# Patient Record
Sex: Female | Born: 1965 | Race: White | Hispanic: No | Marital: Single | Smoking: Current every day smoker
Health system: Southern US, Community
[De-identification: ages and names within clinical notes are randomized; demographics above are authoritative.]

## PROBLEM LIST (undated history)

## (undated) DIAGNOSIS — I10 Essential (primary) hypertension: Secondary | ICD-10-CM

## (undated) DIAGNOSIS — R51 Headache: Secondary | ICD-10-CM

## (undated) DIAGNOSIS — D649 Anemia, unspecified: Secondary | ICD-10-CM

## (undated) DIAGNOSIS — R519 Headache, unspecified: Secondary | ICD-10-CM

## (undated) DIAGNOSIS — Z9289 Personal history of other medical treatment: Secondary | ICD-10-CM

## (undated) HISTORY — PX: TUBAL LIGATION: SHX77

## (undated) HISTORY — DX: Headache, unspecified: R51.9

## (undated) HISTORY — DX: Essential (primary) hypertension: I10

## (undated) HISTORY — DX: Headache: R51

## (undated) HISTORY — DX: Anemia, unspecified: D64.9

## (undated) HISTORY — DX: Personal history of other medical treatment: Z92.89

---

## 2000-05-24 ENCOUNTER — Encounter: Admission: RE | Admit: 2000-05-24 | Discharge: 2000-08-22 | Payer: Self-pay | Admitting: Gynecology

## 2000-05-24 ENCOUNTER — Other Ambulatory Visit: Admission: RE | Admit: 2000-05-24 | Discharge: 2000-05-24 | Payer: Self-pay | Admitting: Gynecology

## 2000-08-22 ENCOUNTER — Encounter: Admission: RE | Admit: 2000-08-22 | Discharge: 2000-11-20 | Payer: Self-pay | Admitting: Internal Medicine

## 2000-11-13 ENCOUNTER — Inpatient Hospital Stay (HOSPITAL_COMMUNITY): Admission: AD | Admit: 2000-11-13 | Discharge: 2000-11-15 | Payer: Self-pay | Admitting: Gynecology

## 2001-01-15 ENCOUNTER — Other Ambulatory Visit: Admission: RE | Admit: 2001-01-15 | Discharge: 2001-01-15 | Payer: Self-pay | Admitting: Gynecology

## 2015-10-15 ENCOUNTER — Encounter (HOSPITAL_BASED_OUTPATIENT_CLINIC_OR_DEPARTMENT_OTHER): Payer: Self-pay | Admitting: *Deleted

## 2015-10-15 ENCOUNTER — Emergency Department (HOSPITAL_BASED_OUTPATIENT_CLINIC_OR_DEPARTMENT_OTHER)
Admission: EM | Admit: 2015-10-15 | Discharge: 2015-10-15 | Disposition: A | Discharge status: Home / Self Care | Payer: 59 | Attending: Emergency Medicine | Admitting: Emergency Medicine

## 2015-10-15 DIAGNOSIS — IMO0001 Reserved for inherently not codable concepts without codable children: Secondary | ICD-10-CM

## 2015-10-15 DIAGNOSIS — R030 Elevated blood-pressure reading, without diagnosis of hypertension: Secondary | ICD-10-CM | POA: Insufficient documentation

## 2015-10-15 DIAGNOSIS — Z720 Tobacco use: Secondary | ICD-10-CM | POA: Insufficient documentation

## 2015-10-15 DIAGNOSIS — F17200 Nicotine dependence, unspecified, uncomplicated: Secondary | ICD-10-CM

## 2015-10-15 MED ORDER — KETOROLAC TROMETHAMINE 60 MG/2ML IM SOLN: 60.0000 mg | Freq: Once | INTRAMUSCULAR | Status: DC

## 2015-10-15 NOTE — ED Provider Notes (Signed)
CSN: [id number]     Arrival date & time 10/15/15  2055 History   First MD Initiated Contact with Patient 10/15/15 2112     Chief Complaint  Patient presents with  . Hypertension     (Consider location/radiation/quality/duration/timing/severity/associated sxs/prior Treatment) HPI   Blood pressure 198/90, pulse 98, temperature 98.3 F (36.8 C), temperature source Oral, resp. rate 20, height 5' (1.524 m), weight 140 lb (63.504 kg), SpO2 95 %.  Carla Phillips is a 49 y.o. female complaining of elevated blood pressure. Patient took her blood pressure at home tonight and it continued to rise that she checked it throughout the day. Patient was seen in urgent care for a sinus infection several days ago and told her blood pressure was high at that point. Patient endorses maxillary headache. She denies change in vision, cervicalgia, unilateral weakness, ataxia, chest pain, shortness of breath, abdominal pain, nausea vomiting, change in bowel or bladder habits. Patient is a daily tobacco user. She doesn't have primary care follow-up. Patient is insured.  History reviewed. No pertinent past medical history. History reviewed. No pertinent past surgical history. No family history on file. Social History  Substance Use Topics  . Smoking status: Current Every Day Smoker -- 0.50 packs/day    Types: Cigarettes  . Smokeless tobacco: None  . Alcohol Use: Yes     Comment: daily   OB History    No data available     Review of Systems  10 systems reviewed and found to be negative, except as noted in the HPI.   Allergies  Review of patient's allergies indicates no known allergies.  Home Medications   Prior to Admission medications   Not on File   BP 198/90 mmHg  Pulse 98  Temp(Src) 98.3 F (36.8 C) (Oral)  Resp 20  Ht 5' (1.524 m)  Wt 140 lb (63.504 kg)  BMI 27.34 kg/m2  SpO2 95% Physical Exam  Constitutional: She is oriented to person, place, and time. She appears well-developed  and well-nourished. No distress.  HENT:  Head: Normocephalic and atraumatic.  Mouth/Throat: Oropharynx is clear and moist.  Eyes: Conjunctivae and EOM are normal. Pupils are equal, round, and reactive to light.  Neck: Normal range of motion.  Cardiovascular: Normal rate, regular rhythm and intact distal pulses.   Pulmonary/Chest: Effort normal and breath sounds normal. No stridor. No respiratory distress. She has no wheezes. She has no rales. She exhibits no tenderness.  Abdominal: Soft. Bowel sounds are normal. She exhibits no distension and no mass. There is no tenderness. There is no rebound and no guarding.  Musculoskeletal: Normal range of motion.  Neurological: She is alert and oriented to person, place, and time.  Psychiatric: She has a normal mood and affect.  Nursing note and vitals reviewed.   ED Course  Procedures (including critical care time) Labs Review Labs Reviewed - No data to display  Imaging Review No results found. I have personally reviewed and evaluated these images and lab results as part of my medical decision-making.   EKG Interpretation None      MDM   Final diagnoses:  Elevated blood pressure  Tobacco use disorder    Filed Vitals:   10/15/15 2102 10/15/15 2206  BP: 198/90 155/84  Pulse: 98 82  Temp: 98.3 F (36.8 C)   TempSrc: Oral   Resp: 20 20  Height: 5' (1.524 m)   Weight: 140 lb (63.504 kg)   SpO2: 95% 98%    Carla Phillips is  49 y.o. female presenting with elevated blood pressure. Patient is asymptomatic. We have had an extensive discussion about smoking cessation and low salt diet. Patient has the option of following with primary care, she is insured. No indication for emergent blood pressure lowering at this time. I believe the patient checking her blood pressure is contributing to her anxiety and raising blood pressure, advised her to stop checking her blood pressure at home.  Evaluation does not show pathology that would  require ongoing emergent intervention or inpatient treatment. Pt is hemodynamically stable and mentating appropriately. Discussed findings and plan with patient/guardian, who agrees with care plan. All questions answered. Return precautions discussed and outpatient follow up given.       Wynetta Emery, PA-C 10/15/15 2210  Geoffery Lyons, MD 10/15/15 2244

## 2015-10-15 NOTE — Discharge Instructions (Signed)
Please follow with your primary care doctor in the next 5 days for high blood pressure evaluation. If you do not have a primary care doctor, present to urgent care. Reduce salt intake. Seek emergency medical care for unilateral weakness, slurring, change in vision, or chest pain and shortness of breath.  Do not hesitate to return to the emergency room for any new, worsening or concerning symptoms.  Please obtain primary care using resource guide below. Let them know that you were seen in the emergency room and that they will need to obtain records for further outpatient management.     Hypertension Hypertension, commonly called high blood pressure, is when the force of blood pumping through your arteries is too strong. Your arteries are the blood vessels that carry blood from your heart throughout your body. A blood pressure reading consists of a higher number over a lower number, such as 110/72. The higher number (systolic) is the pressure inside your arteries when your heart pumps. The lower number (diastolic) is the pressure inside your arteries when your heart relaxes. Ideally you want your blood pressure below 120/80. Hypertension forces your heart to work harder to pump blood. Your arteries may become narrow or stiff. Having untreated or uncontrolled hypertension can cause heart attack, stroke, kidney disease, and other problems. RISK FACTORS Some risk factors for high blood pressure are controllable. Others are not.  Risk factors you cannot control include:   Race. You may be at higher risk if you are African American.  Age. Risk increases with age.  Gender. Men are at higher risk than women before age 49 years. After age 62, women are at higher risk than men. Risk factors you can control include:  Not getting enough exercise or physical activity.  Being overweight.  Getting too much fat, sugar, calories, or salt in your diet.  Drinking too much alcohol. SIGNS AND  SYMPTOMS Hypertension does not usually cause signs or symptoms. Extremely high blood pressure (hypertensive crisis) may cause headache, anxiety, shortness of breath, and nosebleed. DIAGNOSIS To check if you have hypertension, your health care provider will measure your blood pressure while you are seated, with your arm held at the level of your heart. It should be measured at least twice using the same arm. Certain conditions can cause a difference in blood pressure between your right and left arms. A blood pressure reading that is higher than normal on one occasion does not mean that you need treatment. If it is not clear whether you have high blood pressure, you may be asked to return on a different day to have your blood pressure checked again. Or, you may be asked to monitor your blood pressure at home for 1 or more weeks. TREATMENT Treating high blood pressure includes making lifestyle changes and possibly taking medicine. Living a healthy lifestyle can help lower high blood pressure. You may need to change some of your habits. Lifestyle changes may include:  Following the DASH diet. This diet is high in fruits, vegetables, and whole grains. It is low in salt, red meat, and added sugars.  Keep your sodium intake below 2,300 mg per day.  Getting at least 30-45 minutes of aerobic exercise at least 4 times per week.  Losing weight if necessary.  Not smoking.  Limiting alcoholic beverages.  Learning ways to reduce stress. Your health care provider may prescribe medicine if lifestyle changes are not enough to get your blood pressure under control, and if one of the following is true:  You are 54-68 years of age and your systolic blood pressure is above 140.  You are 75 years of age or older, and your systolic blood pressure is above 150.  Your diastolic blood pressure is above 90.  You have diabetes, and your systolic blood pressure is over 140 or your diastolic blood pressure is over  90.  You have kidney disease and your blood pressure is above 140/90.  You have heart disease and your blood pressure is above 140/90. Your personal target blood pressure may vary depending on your medical conditions, your age, and other factors. HOME CARE INSTRUCTIONS  Have your blood pressure rechecked as directed by your health care provider.   Take medicines only as directed by your health care provider. Follow the directions carefully. Blood pressure medicines must be taken as prescribed. The medicine does not work as well when you skip doses. Skipping doses also puts you at risk for problems.  Do not smoke.   Monitor your blood pressure at home as directed by your health care provider. SEEK MEDICAL CARE IF:   You think you are having a reaction to medicines taken.  You have recurrent headaches or feel dizzy.  You have swelling in your ankles.  You have trouble with your vision. SEEK IMMEDIATE MEDICAL CARE IF:  You develop a severe headache or confusion.  You have unusual weakness, numbness, or feel faint.  You have severe chest or abdominal pain.  You vomit repeatedly.  You have trouble breathing. MAKE SURE YOU:   Understand these instructions.  Will watch your condition.  Will get help right away if you are not doing well or get worse.   This information is not intended to replace advice given to you by your health care provider. Make sure you discuss any questions you have with your health care provider.   Document Released: 12/12/2005 Document Revised: 04/28/2015 Document Reviewed: 10/04/2013 Elsevier Interactive Patient Education 2016 [employer]. Emergency Department Resource Guide 1) Find a Doctor and Pay Out of Pocket Although you won't have to find out who is covered by your insurance plan, it is a good idea to ask around and get recommendations. You will then need to call the office and see if the doctor you have chosen will accept you as a new  patient and what types of options they offer for patients who are self-pay. Some doctors offer discounts or will set up payment plans for their patients who do not have insurance, but you will need to ask so you aren't surprised when you get to your appointment.  2) Contact Your Local Health Department Not all health departments have doctors that can see patients for sick visits, but many do, so it is worth a call to see if yours does. If you don't know where your local health department is, you can check in your phone book. The CDC also has a tool to help you locate your state's health department, and many state websites also have listings of all of their local health departments.  3) Find a Walk-in Clinic If your illness is not likely to be very severe or complicated, you may want to try a walk in clinic. These are popping up all over the country in pharmacies, drugstores, and shopping centers. They're usually staffed by nurse practitioners or physician assistants that have been trained to treat common illnesses and complaints. They're usually fairly quick and inexpensive. However, if you have serious medical issues or chronic medical problems, these are  probably not your best option.  No Primary Care Doctor: - Call Health Connect at  [phone] - they can help you locate a primary care doctor that  accepts your insurance, provides certain services, etc. - Physician Referral Service- [phone]  Chronic Pain Problems: Organization         Address  Phone   Notes  [facility]  [phone]) [phone] Patients need to be referred by their primary care doctor.   Medication Assistance: Organization         Address  Phone   Notes  [facility] Medication [facility] [address] [place], [place] [address] [phone] --Must be a resident of [place] [facility] [place] -- Must have NO insurance coverage whatsoever (no Medicaid/ Medicare, etc.) -- The pt. MUST have a primary  care doctor that directs their care regularly and follows them in the community   Med[facility]ist  [phone]   [employer]  [phone]    Agencies that provide inexpensive medical care: Organization         Address  Phone   Notes  [facility]  [phone]   [facility]    [phone]   [facility] [address] [place], [place] [address] [phone]) [phone]   [facility] [address], [place] [phone]   [facility]anned Parenthood    [phone]) [phone]   [facility]    [phone]   [facility]  [address], [place] Phone:  [phone], Fax:  [phone] Hours of Operation:  9 am - 6 pm, M-F.  Also accepts Medicaid/Medicare and self-pay.  [facility]  [address], [place] Phone: [phone]) [phone], Fax: [phone]) [phone]. Hours of Operation:  8:30 am - 5:30 pm, M-F.  Also accepts Medicaid and self-pay.  [facility] [place] [address], [place] Phone: [phone](336) [phone]   [facility] [address], [place], [place] [phone], Ext. [phone] Mondays & Thursdays: 7-9 AM.  First 15 patients are seen on a first come, first serve basis.    Medicaid-accepting [facility] Providers:  Organization         Address  Phone   Notes  [facility] [address], [place] [phone] Also accepts self-pay patients.  [facility] [address], [place]  [phone]   [facility] [address], [place] [phone]) [phone]   [facility] [address] [place] [address], [place] [phone]) [phone]   Renaye RakersVeita Bland [address], [place]   [phone] Only accepts [place] Access [place] patients after they have their name applied to their card.   Self-Pay (no insurance) in [facility]  [place]:  Organization         Address  Phone   Notes  Sickle Cell Patients, [facility] [address], [place] [phone]   [facility] [address], [place] [phone]   [facility]  [address], [place] [phone]   [facility]/Dr. Osei-Bonsu  [address] [place] [address], [place] or [phone], [place] [phone] Phone number for both [place] and [place] locations is the same.  [facility]  [address], [place] [phone]   [facility] [address] [place] or [address] [phone]   [facility] [address], [place] [phone], phone; [phone], fax Sees patients 1st and 3rd Saturday of every month.  Must not qualify for public or private insurance (i.e. Medicaid, Medicare, [place] Health Choice, Veterans' Benefits)  Household income should be no more than 200% of the poverty level The clinic cannot treat you if you are pregnant or think you are pregnant  Sexually transmitted diseases are not treated at the clinic.    Dental Care: Organization         Address  Phone  Notes  [facility] [address] [place], [place] [phone] Accepts children up to age 68 who are enrolled in [place] or [place] Health Choice; pregnant women with a Medicaid card; and children who have applied for Medicaid or [place] Health Choice, but were declined, whose parents can pay a reduced fee at time of service.  [facility]  [address], [place] [phone] Accepts children up to age 26 who are enrolled in [place] or [place] Health Choice; pregnant women with a Medicaid card; and children who have applied for Medicaid or [place] Health Choice, but were declined, whose parents can  pay a reduced fee at time of service.  [facility]  [address] [place], [place] [phone] Patients are seen by appointment only. Walk-ins are not accepted. [facility] will see patients 11 years of age and older. Monday - Tuesday (8am-5pm) Most Wednesdays (8:30-5pm) $30 per visit, cash only  [facility]  [address], [facility] [phone] Patients are seen by appointment only. Walk-ins are not accepted. [facility] will see patients 70 years of age and older. One Wednesday Evening (Monthly: Volunteer Based).  $30 per visit, cash only  [employer]  [phone] for adults; Children under age 59, call Graduate Pediatric Dentistry at [phone]. Children aged 104-14, please call [phone] to request a pediatric application.  Dental services are provided in all areas of dental care including fillings, crowns and bridges, complete and partial dentures, implants, gum treatment, root canals, and extractions. Preventive care is also provided. Treatment is provided to both adults and children. Patients are selected via a lottery and there is often a waiting list.   [facility] [address], [place]  [phone] [email or web address]   [facility] [address] [place], [place] [phone], Ext. [phone] Second and Fourth Thursday of each month, opens at 6:30 AM; Clinic ends at 9 AM.  Patients are seen on a first-come first-served basis, and a limited number are seen during each clinic.   [facility]  [address] [place] [place], [place] [phone]   Eligibility Requirements You must have lived in [place], [place], or [place] counties for at least the last three months.   You cannot be eligible for state or federal sponsored [place], including [employer], [place], or [employer].   You generally cannot be eligible for healthcare  insurance through your employer.    How to apply: Eligibility screenings are held every Tuesday and Wednesday afternoon from 1:00 pm until 4:00 pm. You do not need an appointment for the interview!  [facility]  [address], [place], [place] [phone]   [facility] Department  [phone]   [facility] Health Department  [phone]   [facility] Health Department  [phone]    Behavioral Health Resources in the Community: Intensive Outpatient Programs Organization         Address  Phone  Notes  [facility] Services [address], [place], [place] [phone]   [facility]patient [address] [place] [address] [place], [place] [phone]   [facility] [address], [place], [place]  [phone]   [facility] [address],  [place], [place] [phone] or [phone]   Substance Abuse Resources Organization         Address  Phone  Notes  Alcohol [facility] Drug Services  [phone]   [facility]  [phone]   [facility] [place]  [phone]   Floydene Flock  [phone]   Residential & Outpatient Substance Abuse Program  [phone]   Psychological Services Organization         Address  Phone  Notes  [facility]  [phone]   [facility]  [phone]   [facility] Mental Health [address] [place] [phone] or [phone]    Mobile Crisis Teams Organization         Address  Phone  Notes  [facility], Mobile Crisis Care Unit  [phone]   [facility]  [address] [place], [place] [phone]   Doristine Locks [address] [place] [phone]    Self-Help/Support Groups Organization         Address  Phone             Notes  [facility] [place] - variety of support groups  [phone]- I7437963 Call for more information  Narcotics Anonymous (NA),  [facility] [address], [employer] [place]  2 meetings at this location   Statistician         Address  Phone  Notes  [facility] [address],    [place]  [phone]   [facility]  [address] [place] [address], [place], [place] [phone]   [facility] [address] [place], [place] [phone] Admissions: 8am-3pm M-F  [facility] [address],    [place], [place] [phone]   [facility] [address] [place], [place], [place] [phone]   [facility] [address]  [place], [place] [phone]   [facility] - Intensive Outpatient [address], [place], [place] [phone]   [facility] ([facility] [address] [place]  [place], [place] [phone] or [phone]   [facility] ([facility]) [address] [place], [place] [phone] Accepts Medicaid  [facility] [place] [address],  [place] [phone] Substance Abuse/Addiction Treatment   [facility] Organization         Address  Phone  Notes  [facility]  [phone]   Angie Fava, PhD [address] [place], [place]   [phone] or [phone]   [facility]al   [address] [place], [place] [phone]   [facility] [address], [place], [place] [phone] Insurance/Medicaid/sponsorship through [employer] and Families [address]  [place], [place] [phone] [place], [place] [phone]    Dr. Adele Schilder  [phone]   [facility] Dept. 1) [address] [place] 2) [place] 3)  [address], [place] [phone]  [phone]    [place] [phone] or [phone] (After Hours)

## 2015-10-15 NOTE — ED Notes (Signed)
[facility]. She took her BP tonight and it was high. No symptoms. States she feels anxious.

## 2015-10-20 ENCOUNTER — Encounter: Payer: Self-pay | Admitting: Family Medicine

## 2015-10-20 ENCOUNTER — Ambulatory Visit (INDEPENDENT_AMBULATORY_CARE_PROVIDER_SITE_OTHER): Payer: 59 | Admitting: Family Medicine

## 2015-10-20 VITALS — BP 164/98 | HR 89 | Temp 98.7°F | Resp 20 | Ht 60.0 in | Wt 142.8 lb

## 2015-10-20 DIAGNOSIS — Z7189 Other specified counseling: Secondary | ICD-10-CM

## 2015-10-20 DIAGNOSIS — Z7689 Persons encountering health services in other specified circumstances: Secondary | ICD-10-CM

## 2015-10-20 DIAGNOSIS — I10 Essential (primary) hypertension: Secondary | ICD-10-CM | POA: Diagnosis not present

## 2015-10-20 DIAGNOSIS — Z0000 Encounter for general adult medical examination without abnormal findings: Secondary | ICD-10-CM | POA: Diagnosis not present

## 2015-10-20 DIAGNOSIS — Z720 Tobacco use: Secondary | ICD-10-CM

## 2015-10-20 DIAGNOSIS — Z23 Encounter for immunization: Secondary | ICD-10-CM

## 2015-10-20 DIAGNOSIS — N951 Menopausal and female climacteric states: Secondary | ICD-10-CM

## 2015-10-20 DIAGNOSIS — Z87891 Personal history of nicotine dependence: Secondary | ICD-10-CM | POA: Insufficient documentation

## 2015-10-20 MED ORDER — HYDROCHLOROTHIAZIDE 25 MG PO TABS: 25.0000 mg | ORAL_TABLET | Freq: Every day | ORAL | Status: DC

## 2015-10-20 NOTE — Patient Instructions (Addendum)
Hypertension Hypertension, commonly called high blood pressure, is when the force of blood pumping through your arteries is too strong. Your arteries are the blood vessels that carry blood from your heart throughout your body. A blood pressure reading consists of a higher number over a lower number, such as 110/72. The higher number (systolic) is the pressure inside your arteries when your heart pumps. The lower number (diastolic) is the pressure inside your arteries when your heart relaxes. Ideally you want your blood pressure below 120/80. Hypertension forces your heart to work harder to pump blood. Your arteries may become narrow or stiff. Having untreated or uncontrolled hypertension can cause heart attack, stroke, kidney disease, and other problems. RISK FACTORS Some risk factors for high blood pressure are controllable. Others are not.  Risk factors you cannot control include:   Race. You may be at higher risk if you are African American.  Age. Risk increases with age.  Gender. Men are at higher risk than women before age 45 years. After age 65, women are at higher risk than men. Risk factors you can control include:  Not getting enough exercise or physical activity.  Being overweight.  Getting too much fat, sugar, calories, or salt in your diet.  Drinking too much alcohol. SIGNS AND SYMPTOMS Hypertension does not usually cause signs or symptoms. Extremely high blood pressure (hypertensive crisis) may cause headache, anxiety, shortness of breath, and nosebleed. DIAGNOSIS To check if you have hypertension, your health care provider will measure your blood pressure while you are seated, with your arm held at the level of your heart. It should be measured at least twice using the same arm. Certain conditions can cause a difference in blood pressure between your right and left arms. A blood pressure reading that is higher than normal on one occasion does not mean that you need treatment. If  it is not clear whether you have high blood pressure, you may be asked to return on a different day to have your blood pressure checked again. Or, you may be asked to monitor your blood pressure at home for 1 or more weeks. TREATMENT Treating high blood pressure includes making lifestyle changes and possibly taking medicine. Living a healthy lifestyle can help lower high blood pressure. You may need to change some of your habits. Lifestyle changes may include:  Following the DASH diet. This diet is high in fruits, vegetables, and whole grains. It is low in salt, red meat, and added sugars.  Keep your sodium intake below 2,300 mg per day.  Getting at least 30-45 minutes of aerobic exercise at least 4 times per week.  Losing weight if necessary.  Not smoking.  Limiting alcoholic beverages.  Learning ways to reduce stress. Your health care provider may prescribe medicine if lifestyle changes are not enough to get your blood pressure under control, and if one of the following is true:  You are 18-59 years of age and your systolic blood pressure is above 140.  You are 60 years of age or older, and your systolic blood pressure is above 150.  Your diastolic blood pressure is above 90.  You have diabetes, and your systolic blood pressure is over 140 or your diastolic blood pressure is over 90.  You have kidney disease and your blood pressure is above 140/90.  You have heart disease and your blood pressure is above 140/90. Your personal target blood pressure may vary depending on your medical conditions, your age, and other factors. HOME CARE INSTRUCTIONS    Have your blood pressure rechecked as directed by your health care provider.   Take medicines only as directed by your health care provider. Follow the directions carefully. Blood pressure medicines must be taken as prescribed. The medicine does not work as well when you skip doses. Skipping doses also puts you at risk for  problems.  Do not smoke.   Monitor your blood pressure at home as directed by your health care provider. SEEK MEDICAL CARE IF:   You think you are having a reaction to medicines taken.  You have recurrent headaches or feel dizzy.  You have swelling in your ankles.  You have trouble with your vision. SEEK IMMEDIATE MEDICAL CARE IF:  You develop a severe headache or confusion.  You have unusual weakness, numbness, or feel faint.  You have severe chest or abdominal pain.  You vomit repeatedly.  You have trouble breathing. MAKE SURE YOU:   Understand these instructions.  Will watch your condition.  Will get help right away if you are not doing well or get worse.   This information is not intended to replace advice given to you by your health care provider. Make sure you discuss any questions you have with your health care provider.   Document Released: 12/12/2005 Document Revised: 04/28/2015 Document Reviewed: 10/04/2013 Elsevier Interactive Patient Education 2016 Elsevier Inc.  Low-Sodium Eating Plan Sodium raises blood pressure and causes water to be held in the body. Getting less sodium from food will help lower your blood pressure, reduce any swelling, and protect your heart, liver, and kidneys. We get sodium by adding salt (sodium chloride) to food. Most of our sodium comes from canned, boxed, and frozen foods. Restaurant foods, fast foods, and pizza are also very high in sodium. Even if you take medicine to lower your blood pressure or to reduce fluid in your body, getting less sodium from your food is important. WHAT IS MY PLAN? Most people should limit their sodium intake to 2,300 mg a day. Your health care provider recommends that you limit your sodium intake to __________ a day.  WHAT DO I NEED TO KNOW ABOUT THIS EATING PLAN? For the low-sodium eating plan, you will follow these general guidelines:  Choose foods with a % Daily Value for sodium of less than 5% (as  listed on the food label).   Use salt-free seasonings or herbs instead of table salt or sea salt.   Check with your health care provider or pharmacist before using salt substitutes.   Eat fresh foods.  Eat more vegetables and fruits.  Limit canned vegetables. If you do use them, rinse them well to decrease the sodium.   Limit cheese to 1 oz (28 g) per day.   Eat lower-sodium products, often labeled as "lower sodium" or "no salt added."  Avoid foods that contain monosodium glutamate (MSG). MSG is sometimes added to [place] food and some canned foods.  Check food labels (Nutrition Facts labels) on foods to learn how much sodium is in one serving.  Eat more home-cooked food and less restaurant, buffet, and fast food.  When eating at a restaurant, ask that your food be prepared with less salt, or no salt if possible.  HOW DO I READ FOOD LABELS FOR SODIUM INFORMATION? The Nutrition Facts label lists the amount of sodium in one serving of the food. If you eat more than one serving, you must multiply the listed amount of sodium by the number of servings. Food labels may also identify foods  as:  Sodium free--Less than 5 mg in a serving.  Very low sodium--35 mg or less in a serving.  Low sodium--140 mg or less in a serving.  Light in sodium--50% less sodium in a serving. For example, if a food that usually has 300 mg of sodium is changed to become light in sodium, it will have 150 mg of sodium.  Reduced sodium--25% less sodium in a serving. For example, if a food that usually has 400 mg of sodium is changed to reduced sodium, it will have 300 mg of sodium. WHAT FOODS CAN I EAT? Grains Low-sodium cereals, including oats, puffed wheat and rice, and shredded wheat cereals. Low-sodium crackers. Unsalted rice and pasta. Lower-sodium bread.  Vegetables Frozen or fresh vegetables. Low-sodium or reduced-sodium canned vegetables. Low-sodium or reduced-sodium tomato sauce and paste.  Low-sodium or reduced-sodium tomato and vegetable juices.  Fruits Fresh, frozen, and canned fruit. Fruit juice.  Meat and Other Protein Products Low-sodium canned tuna and salmon. Fresh or frozen meat, poultry, seafood, and fish. Lamb. Unsalted nuts. Dried beans, peas, and lentils without added salt. Unsalted canned beans. Homemade soups without salt. Eggs.  Dairy Milk. Soy milk. Ricotta cheese. Low-sodium or reduced-sodium cheeses. Yogurt.  Condiments Fresh and dried herbs and spices. Salt-free seasonings. Onion and garlic powders. Low-sodium varieties of mustard and ketchup. Fresh or refrigerated horseradish. Lemon juice.  Fats and Oils Reduced-sodium salad dressings. Unsalted butter.  Other Unsalted popcorn and pretzels.  The items listed above may not be a complete list of recommended foods or beverages. Contact your dietitian for more options. WHAT FOODS ARE NOT RECOMMENDED? Grains Instant hot cereals. Bread stuffing, pancake, and biscuit mixes. Croutons. Seasoned rice or pasta mixes. Noodle soup cups. Boxed or frozen macaroni and cheese. Self-rising flour. Regular salted crackers. Vegetables Regular canned vegetables. Regular canned tomato sauce and paste. Regular tomato and vegetable juices. Frozen vegetables in sauces. Salted JamaicaFrench fries. Olives. Rosita FirePickles. Relishes. Sauerkraut. Salsa. Meat and Other Protein Products Salted, canned, smoked, spiced, or pickled meats, seafood, or fish. Bacon, ham, sausage, hot dogs, corned beef, chipped beef, and packaged luncheon meats. Salt pork. Jerky. Pickled herring. Anchovies, regular canned tuna, and sardines. Salted nuts. Dairy Processed cheese and cheese spreads. Cheese curds. Blue cheese and cottage cheese. Buttermilk.  Condiments Onion and garlic salt, seasoned salt, table salt, and sea salt. Canned and packaged gravies. Worcestershire sauce. Tartar sauce. Barbecue sauce. Teriyaki sauce. Soy sauce, including reduced sodium. Steak  sauce. Fish sauce. Oyster sauce. Cocktail sauce. Horseradish that you find on the shelf. Regular ketchup and mustard. Meat flavorings and tenderizers. Bouillon cubes. Hot sauce. Tabasco sauce. Marinades. Taco seasonings. Relishes. Fats and Oils Regular salad dressings. Salted butter. Margarine. Ghee. Bacon fat.  Other Potato and tortilla chips. Corn chips and puffs. Salted popcorn and pretzels. Canned or dried soups. Pizza. Frozen entrees and pot pies.  The items listed above may not be a complete list of foods and beverages to avoid. Contact your dietitian for more information.   This information is not intended to replace advice given to you by your health care provider. Make sure you discuss any questions you have with your health care provider.   Document Released: 06/03/2002 Document Revised: 01/02/2015 Document Reviewed: 10/16/2013 Elsevier Interactive Patient Education 2016 Elsevier Inc.    BP goal: <140 top number and <80 bottom number. NO lower than 110 top number --> watch for signs of hypotension and call if you experience. (dizziness)  Nurse visit 1 week for BP recheck and labs. (fasting)

## 2015-10-20 NOTE — Progress Notes (Signed)
Subjective:    Patient ID: Carla Phillips, female    DOB: January 26, 1966, 49 y.o.   MRN: [id number]  HPI  Patient presents for new patient establishment with complaints of high blood pressure. All past medical history, surgical history, allergies, family history, immunizations and social history was obtained from the patient today and entered into the electronic medical record. Records are requested from her prior PCP, and will be reviewed at the time they are received. All medical records will be updated at that time.  High blood pressure: Patient reports she was in urgent care for a sinus infection, and was told she had high blood pressure. She states she doesn't recall how high it was, but states that the provider wanted to admit her. Patient had been seen since then and a different urgent care, within our electronic medical record, and her blood pressure at that time was 155/84. She was told she needed to establish with a primary care provider to have follow-up on her blood pressure, what brings her in today. Patient states she has taken her blood pressure home over the last few days, and the lowest systolic was 093, and the highest systolic was 818. Patient denies any chest pain, shortness of breath, visual changes, orthopnea, lower extremity edema or dizziness. Patient states she used to exercise approximately 30 minutes a day, but has gotten away from doing that in the last few months. She states she eats good, watching her diet for fats, but does admit to eating salt. Patient denies any family history of heart disease, hypertension or hyperlipidemia. Patient isn't every day smoker.  Tobacco abuse: Patient states she smokes Marlboro light cigarettes, approximately half a pack to 1 pack a day. She's been on this for approximately 30 years. She does not desire to quit at this time.  Health maintenance:  Colonoscopy: No family history, routine screening age 28 Mammogram: No family history,  routine screening age 59 Cervical cancer screening: 2005, Pap smear indicated Immunizations: Flu vaccination indicated, tetanus indicated, pneumococcal indicated (smoker) Infectious disease screening: HIV indicated  Perimenopausal: Patient states her last menstrual period was June or July. She denies mood swings or hot flashes at this time.  Past Medical History  Diagnosis Date  . Anemia   . Hypertension   . Frequent headaches    No Known Allergies Past Surgical History  Procedure Laterality Date  . Tubal ligation     Family History  Problem Relation Age of Onset  . Arthritis Mother   . COPD Father   . Cancer Neg Hx   . Heart disease Neg Hx    Social History   Social History  . Marital Status: Single    Spouse Name: N/A  . Number of Children: N/A  . Years of Education: N/A   Occupational History  . Not on file.   Social History Main Topics  . Smoking status: Current Every Day Smoker -- 0.50 packs/day    Types: Cigarettes  . Smokeless tobacco: Not on file  . Alcohol Use: Yes     Comment: daily; 3 beers a day  . Drug Use: No  . Sexual Activity: Yes   Other Topics Concern  . 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 Negative for diaphoresis, appetite change, fatigue and unexpected weight change.  HENT: Negative for facial swelling, hearing loss,  postnasal drip, sore throat, tinnitus, trouble swallowing and voice change.   Eyes: Negative for photophobia and visual disturbance.  Respiratory: Negative for chest tightness, shortness of breath and wheezing.   Cardiovascular: Negative for chest pain, palpitations and leg swelling.  Gastrointestinal: Negative for abdominal pain, diarrhea, constipation and blood in stool.  Endocrine: Negative for polyphagia and polyuria.  Genitourinary: Positive for menstrual problem. Negative for dysuria, frequency,  hematuria, vaginal bleeding and vaginal pain.  Skin: Negative.   Neurological: Negative for dizziness, speech difficulty, weakness and headaches.  Hematological: Negative for adenopathy.  Psychiatric/Behavioral: Negative for dysphoric mood. The patient is not nervous/anxious.       Objective:   Physical Exam BP 164/98 mmHg  Pulse 89  Temp(Src) 98.7 F (37.1 C) (Oral)  Resp 20  Ht 5' (1.524 m)  Wt 142 lb 12 oz (64.751 kg)  BMI 27.88 kg/m2  SpO2 96% Gen: Afebrile. No acute distress. Nontoxic in appearance, well-developed, well-nourished, Caucasian female. Mildly overweight. Pleasant. HENT: AT. [place]. Bilateral TM visualized and normal in appearance. MMM. Bilateral nares mild erythema, no swelling. Throat without erythema or exudates. No hoarseness on exam Eyes:Pupils Equal Round Reactive to light, Extraocular movements intact,  Conjunctiva without redness, discharge or icterus. Neck/lymp/endocrine: Supple, no lymphadenopathy, no thyromegaly CV: RRR, no edema, +2/4 P posterior tibialis pulses Chest: CTAB, no wheeze or crackles Abd: Soft. Round. NTND. BS present.  Neuro: Normal gait. PERLA. EOMi. Alert. Oriented x3. Psych: Normal affect, dress and demeanor. Normal speech. Normal thought content and judgment..      Assessment & Plan:  1. Essential hypertension - Start HCTZ 25 mg daily. Patient to follow-up in one week for nurse visit to have BMP rechecked, and blood pressure rechecked. If blood pressure within parameters, will follow-up in 3 months. Otherwise we'll attempt to make medication changes over the phone, with follow-up in one week. - Patient encouraged to increase her exercise to 150 minutes a week, eating a low-salt diet, low saturated/trans-fat diet. - CBC w/Diff - Comp Met (CMET) - TSH - HgB A1c - Lipid panel; Future - Basic Metabolic Panel (BMET); Future - hydrochlorothiazide (HYDRODIURIL) 25 MG tablet; Take 1 tablet (25 mg total) by mouth daily.  Dispense: 30 tablet;  Refill: 0    2. Health care maintenance Health maintenance:  Colonoscopy: No family history, routine screening age 42 Mammogram: No family history, routine screening age 63 Cervical cancer screening: 2005, Pap smear indicated--> patient to make appointment for complete physical within the next 3 months which will include Pap smear Immunizations: Flu vaccination declined, tetanus administered, pneumococcal indicated (smoker) declined Infectious disease screening: HIV declined  3. Encounter to establish care - Patient to make an appointment to have complete physical with Pap within 3 months.  4. Tobacco abuse - Patient declined smoking cessation today.  One week nurse visit

## 2015-10-21 ENCOUNTER — Telehealth: Payer: Self-pay | Admitting: Family Medicine

## 2015-10-21 LAB — CBC WITH DIFFERENTIAL/PLATELET
BASOS ABS: 0 10*3/uL (ref 0.0–0.1)
BASOS PCT: 0.2 % (ref 0.0–3.0)
Eosinophils Absolute: 0.1 10*3/uL (ref 0.0–0.7)
Eosinophils Relative: 1 % (ref 0.0–5.0)
HEMATOCRIT: 42.9 % (ref 36.0–46.0)
Hemoglobin: 14 g/dL (ref 12.0–15.0)
LYMPHS PCT: 33.2 % (ref 12.0–46.0)
Lymphs Abs: 3.2 10*3/uL (ref 0.7–4.0)
MCHC: 32.7 g/dL (ref 30.0–36.0)
MCV: 99.8 fl (ref 78.0–100.0)
MONOS PCT: 7 % (ref 3.0–12.0)
Monocytes Absolute: 0.7 10*3/uL (ref 0.1–1.0)
NEUTROS ABS: 5.6 10*3/uL (ref 1.4–7.7)
Neutrophils Relative %: 58.6 % (ref 43.0–77.0)
PLATELETS: 287 10*3/uL (ref 150.0–400.0)
RBC: 4.3 Mil/uL (ref 3.87–5.11)
RDW: 13.4 % (ref 11.5–15.5)
WBC: 9.6 10*3/uL (ref 4.0–10.5)

## 2015-10-21 LAB — COMPREHENSIVE METABOLIC PANEL
ALBUMIN: 4.3 g/dL (ref 3.5–5.2)
ALK PHOS: 69 U/L (ref 39–117)
ALT: 12 U/L (ref 0–35)
AST: 14 U/L (ref 0–37)
BILIRUBIN TOTAL: 0.3 mg/dL (ref 0.2–1.2)
BUN: 18 mg/dL (ref 6–23)
CALCIUM: 9.7 mg/dL (ref 8.4–10.5)
CO2: 26 meq/L (ref 19–32)
Chloride: 106 mEq/L (ref 96–112)
Creatinine, Ser: 0.92 mg/dL (ref 0.40–1.20)
GFR: 69.02 mL/min (ref >60.00)
Glucose, Bld: 92 mg/dL (ref 70–99)
Potassium: 4.2 mEq/L (ref 3.5–5.1)
Sodium: 140 mEq/L (ref 135–145)
Total Protein: 6.7 g/dL (ref 6.0–8.3)

## 2015-10-21 LAB — TSH: TSH: 0.93 u[IU]/mL (ref 0.35–4.50)

## 2015-10-21 LAB — HEMOGLOBIN A1C: Hgb A1c MFr Bld: 5.2 % (ref 4.6–6.5)

## 2015-10-21 NOTE — Telephone Encounter (Signed)
Left message with results on patient voice mail 

## 2015-10-21 NOTE — Telephone Encounter (Signed)
Please call pt: all labs look great.  - she as an appt for nurse BP recheck after starting med and BMP recheck.

## 2015-10-28 ENCOUNTER — Telehealth: Payer: Self-pay | Admitting: Family Medicine

## 2015-10-28 ENCOUNTER — Ambulatory Visit (INDEPENDENT_AMBULATORY_CARE_PROVIDER_SITE_OTHER): Payer: 59 | Admitting: Family Medicine

## 2015-10-28 DIAGNOSIS — I10 Essential (primary) hypertension: Secondary | ICD-10-CM | POA: Diagnosis not present

## 2015-10-28 LAB — LIPID PANEL
CHOL/HDL RATIO: 2
Cholesterol: 166 mg/dL (ref 0–200)
HDL: 78.9 mg/dL (ref >39.00)
LDL Cholesterol: 71 mg/dL (ref 0–99)
NONHDL: 87.51
TRIGLYCERIDES: 81 mg/dL (ref 0.0–149.0)
VLDL: 16.2 mg/dL (ref 0.0–40.0)

## 2015-10-28 LAB — BASIC METABOLIC PANEL
BUN: 12 mg/dL (ref 6–23)
CHLORIDE: 97 meq/L (ref 96–112)
CO2: 33 meq/L — AB (ref 19–32)
CREATININE: 0.79 mg/dL (ref 0.40–1.20)
Calcium: 9.8 mg/dL (ref 8.4–10.5)
GFR: 82.28 mL/min (ref >60.00)
Glucose, Bld: 79 mg/dL (ref 70–99)
POTASSIUM: 3.3 meq/L — AB (ref 3.5–5.1)
Sodium: 140 mEq/L (ref 135–145)

## 2015-10-28 NOTE — Telephone Encounter (Signed)
Pt came in today for BP check and labs.  BP was 139/86.  Pt wanted to get something for anxiety.  She thinks he BP is elevated due to anxiety.  I told pt she should discuss at OV but pt wanted me to send a message.  Please advise.

## 2015-10-28 NOTE — Telephone Encounter (Signed)
BP is in normal parameters wit medication. No further changes at this time. We will follow up in 3 months for hypertension and we can complete her CPE with PAP at that time. If she is wanting to discuss anxiety, and medications, she will need to be evaluated. Please encouraged her to make an appointment.

## 2015-10-28 NOTE — Telephone Encounter (Signed)
Left message with instructions on patient voice mail. 

## 2015-10-29 ENCOUNTER — Telehealth: Payer: Self-pay | Admitting: Family Medicine

## 2015-10-29 NOTE — Telephone Encounter (Signed)
Spoke with patient reviewed labs and instructions. Patient verbalized understanding. 

## 2015-10-29 NOTE — Telephone Encounter (Signed)
Left message for patient to call back to review labs and instructions. 

## 2015-10-29 NOTE — Telephone Encounter (Signed)
Please call pt: - Her labs are normal, with the exception of mildly low potassium. She should be encouraged to eat foods higher in potassium and/or take a multivitamin with potassium. We will re-check on next appt. if still abnormal with above changes may need to add a daily prescribed potassium pill.  Potassium rich: Bananas, avocado, spinach, sweet potato, etc.

## 2015-11-13 ENCOUNTER — Other Ambulatory Visit: Payer: Self-pay | Admitting: *Deleted

## 2015-11-13 DIAGNOSIS — I10 Essential (primary) hypertension: Secondary | ICD-10-CM

## 2015-11-13 MED ORDER — HYDROCHLOROTHIAZIDE 25 MG PO TABS: 25.0000 mg | ORAL_TABLET | Freq: Every day | ORAL | Status: DC

## 2015-11-13 NOTE — Telephone Encounter (Signed)
HCTZ refilled per refill protocol 

## 2015-11-17 ENCOUNTER — Other Ambulatory Visit: Payer: Self-pay | Admitting: *Deleted

## 2015-11-17 DIAGNOSIS — I10 Essential (primary) hypertension: Secondary | ICD-10-CM

## 2015-11-17 MED ORDER — HYDROCHLOROTHIAZIDE 25 MG PO TABS: 25.0000 mg | ORAL_TABLET | Freq: Every day | ORAL | Status: DC

## 2015-11-17 NOTE — Telephone Encounter (Signed)
Refilled HCTZ per refill protocol 

## 2016-03-14 ENCOUNTER — Other Ambulatory Visit: Payer: Self-pay | Admitting: *Deleted

## 2016-03-14 DIAGNOSIS — I10 Essential (primary) hypertension: Secondary | ICD-10-CM

## 2016-03-14 MED ORDER — HYDROCHLOROTHIAZIDE 25 MG PO TABS: 25.0000 mg | ORAL_TABLET | Freq: Every day | ORAL | Status: DC

## 2016-03-21 ENCOUNTER — Ambulatory Visit (INDEPENDENT_AMBULATORY_CARE_PROVIDER_SITE_OTHER): Payer: BLUE CROSS/BLUE SHIELD | Admitting: Family Medicine

## 2016-03-21 ENCOUNTER — Other Ambulatory Visit (HOSPITAL_COMMUNITY)
Admission: RE | Admit: 2016-03-21 | Discharge: 2016-03-21 | Disposition: A | Discharge status: Home / Self Care | Payer: BLUE CROSS/BLUE SHIELD | Source: Ambulatory Visit | Attending: Family Medicine | Admitting: Family Medicine

## 2016-03-21 ENCOUNTER — Encounter: Payer: Self-pay | Admitting: Family Medicine

## 2016-03-21 VITALS — BP 166/86 | HR 100 | Temp 98.1°F | Resp 20 | Wt 142.0 lb

## 2016-03-21 DIAGNOSIS — Z124 Encounter for screening for malignant neoplasm of cervix: Secondary | ICD-10-CM | POA: Diagnosis not present

## 2016-03-21 DIAGNOSIS — Z0000 Encounter for general adult medical examination without abnormal findings: Secondary | ICD-10-CM

## 2016-03-21 DIAGNOSIS — Z01419 Encounter for gynecological examination (general) (routine) without abnormal findings: Secondary | ICD-10-CM | POA: Diagnosis present

## 2016-03-21 DIAGNOSIS — Z1151 Encounter for screening for human papillomavirus (HPV): Secondary | ICD-10-CM | POA: Insufficient documentation

## 2016-03-21 DIAGNOSIS — I10 Essential (primary) hypertension: Secondary | ICD-10-CM | POA: Diagnosis not present

## 2016-03-21 DIAGNOSIS — Z1239 Encounter for other screening for malignant neoplasm of breast: Secondary | ICD-10-CM | POA: Diagnosis not present

## 2016-03-21 MED ORDER — HYDROCHLOROTHIAZIDE 50 MG PO TABS: 50.0000 mg | ORAL_TABLET | Freq: Every day | ORAL | Status: DC

## 2016-03-21 NOTE — Progress Notes (Signed)
Patient ID: Carla Phillips, female   DOB: 1966/05/19, 50 y.o.   MRN: [id number]      Patient ID: Carla Phillips, female  DOB: 1966/07/09, 50 y.o.   MRN: [id number]  Subjective:  Carla Phillips is a 50 y.o.  All recent labs, ED visits and hospitalizations within the last year were reviewed.  Well women exam/screening for cervical cancer: Pt reports she was perimenopausal, nad had not had a menstrual cycle for 7 months. However, now she has had for normal occuring periods for the last 4-5 months/cycles. The cycles last 5-6 days in duration. Patient's last menstrual period was 03/15/2016. Patient has yet to be screened for breast cancer, no family history. She does perform SBE. She denies dyspareunia, vaginal irritation or discharge today.   Hypertension: Blood pressure above goal today, pt reports compliance with HCTZ 25 mg daily. She does not check her BP at home. She reports not eating high salt foods . She does not exercise routinely.  Past Medical History  Diagnosis Date  . Anemia   . Hypertension   . Frequent headaches   . History of blood transfusion    No Known Allergies Past Surgical History  Procedure Laterality Date  . Tubal ligation     Family History  Problem Relation Age of Onset  . Arthritis Mother   . COPD Father   . Cancer Neg Hx   . Heart disease Neg Hx    Social History   Social History  . Marital Status: Single    Spouse Name: N/A  . Number of Children: N/A  . Years of Education: N/A   Occupational History  . Not on file.   Social History Main Topics  . Smoking status: Current Every Day Smoker -- 0.50 packs/day    Types: Cigarettes  . Smokeless tobacco: Not on file  . Alcohol Use: Yes     Comment: daily; 3 beers a day  . Drug Use: No  . Sexual Activity: Yes   Other Topics Concern  . 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.   Current every day smoker. Drinks 3 beers a day. Denies recreational drugs or caffeine use. Denies herbal remedies.   Wear seatbelt. Does not exercise routinely.   Smoke alarm present home.         ROS: Negative, with the exception of above mentioned in HPI  Objective: BP 166/86 mmHg  Pulse 100  Temp(Src) 98.1 F (36.7 C)  Resp 20  Wt 142 lb (64.411 kg)  SpO2 99%  LMP 03/15/2016 Gen: Afebrile. No acute distress. Nontoxic in appearance, well-developed, well-nourished, female, pleasant female.  HENT: AT. [place]. MMM, no oral lesions.   Eyes:Pupils Equal Round Reactive to light, Extraocular movements intact,  Conjunctiva without redness, discharge or icterus. CV: RRR, no edema Abd: Soft.Round. NTND. BS present Breasts: breasts appear normal, symmetrical, no tenderness on exam, no suspicious masses, no skin or nipple changes or axillary nodes. GYN:  External genitalia within normal limits, normal hair distribution, no lesions. Urethral meatus normal, no lesions. Vaginal mucosa pink, moist, normal rugae, no lesions. No cystocele or rectocele. cervix without lesions, no discharge,  Old blood at OS.   exam.  Bimanual exam revealed normal uterus.  No bladder/suprapubic fullness, masses or tenderness. No cervical motion tenderness. No adnexal fullness. Anus and perineum within normal limits, no lesions.  Assessment/plan: Carla Phillips is a 50 y.o. female present for cervical cancer screening/well women exam.  1. Essential hypertension - increased HCTZ to 50 mg today. Pt to follow up in 1 week with nurse visit for recheck.  - hydrochlorothiazide (HYDRODIURIL) 50 MG tablet; Take 1 tablet (50 mg total) by mouth daily.  Dispense: 30 tablet; Refill: 5   2. Pap smear for cervical cancer screening - Cytology - PAP, with HPV co-test  3. Screening for malignant neoplasm of breast - ordered, pt to schedule after she returns from her vacation.  - MM Digital Diagnostic  Bilat; Future  Patient was encouraged to exercise greater than 150 minutes a week. Patient was encouraged to choose a diet filled with fresh fruits and vegetables, and lean meats. AVS provided to patient today for education/recommendation on gender specific health and safety maintenance.  Follow up 1 week nurse visit for HTN recheck  > 25 minutes spent with patient, >50% of time spent face to face counseling patient and coordinating care.  Electronically signed by: Felix Pacinienee Tehila Sokolow, DO [facility]

## 2016-03-21 NOTE — Patient Instructions (Signed)
Increase HCTZ to 50 mg daily (can take at same time). New script called is 3050, can take 2 of the ones you have currently.  Make a nurse visit for after the beach to have BP rechecked.  We will call you with pap results.  I will order the mammogram for you, have it completed with in the next month if able.  Have fun at the beach!!!

## 2016-03-23 ENCOUNTER — Other Ambulatory Visit: Payer: Self-pay | Admitting: Family Medicine

## 2016-03-23 DIAGNOSIS — Z1239 Encounter for other screening for malignant neoplasm of breast: Secondary | ICD-10-CM

## 2016-03-24 LAB — CYTOLOGY - PAP

## 2016-03-28 ENCOUNTER — Telehealth: Payer: Self-pay | Admitting: Family Medicine

## 2016-03-28 NOTE — Telephone Encounter (Signed)
Left message with pap results on patient voice mail

## 2016-03-28 NOTE — Telephone Encounter (Signed)
Please call pt: - Her PAP is normal with negative HPV. Next PAP 3 years (as long as prior paps normal x3)

## 2016-03-30 ENCOUNTER — Ambulatory Visit: Payer: BLUE CROSS/BLUE SHIELD | Admitting: *Deleted

## 2016-03-30 VITALS — BP 148/90 | HR 91 | Resp 20

## 2016-03-30 DIAGNOSIS — I10 Essential (primary) hypertension: Secondary | ICD-10-CM

## 2016-03-30 NOTE — Progress Notes (Signed)
Patient presents today for BP check. Results documented. Dr Kuneff aware. 

## 2016-05-11 ENCOUNTER — Encounter: Payer: Self-pay | Admitting: Family Medicine

## 2016-06-15 ENCOUNTER — Ambulatory Visit: Payer: BLUE CROSS/BLUE SHIELD

## 2016-07-04 ENCOUNTER — Ambulatory Visit: Payer: BLUE CROSS/BLUE SHIELD

## 2016-07-19 ENCOUNTER — Ambulatory Visit
Admission: RE | Admit: 2016-07-19 | Discharge: 2016-07-19 | Disposition: A | Discharge status: Home / Self Care | Payer: BLUE CROSS/BLUE SHIELD | Source: Ambulatory Visit | Attending: Family Medicine | Admitting: Family Medicine

## 2016-07-19 DIAGNOSIS — Z1239 Encounter for other screening for malignant neoplasm of breast: Secondary | ICD-10-CM

## 2016-07-19 DIAGNOSIS — Z1231 Encounter for screening mammogram for malignant neoplasm of breast: Secondary | ICD-10-CM | POA: Diagnosis not present

## 2016-07-20 ENCOUNTER — Other Ambulatory Visit: Payer: Self-pay | Admitting: Family Medicine

## 2016-07-20 DIAGNOSIS — R928 Other abnormal and inconclusive findings on diagnostic imaging of breast: Secondary | ICD-10-CM

## 2016-07-26 ENCOUNTER — Ambulatory Visit
Admission: RE | Admit: 2016-07-26 | Discharge: 2016-07-26 | Disposition: A | Discharge status: Home / Self Care | Payer: BLUE CROSS/BLUE SHIELD | Source: Ambulatory Visit | Attending: Family Medicine | Admitting: Family Medicine

## 2016-07-26 DIAGNOSIS — R928 Other abnormal and inconclusive findings on diagnostic imaging of breast: Secondary | ICD-10-CM

## 2016-07-26 DIAGNOSIS — N63 Unspecified lump in breast: Secondary | ICD-10-CM | POA: Diagnosis not present

## 2016-09-19 ENCOUNTER — Other Ambulatory Visit: Payer: Self-pay | Admitting: Family Medicine

## 2016-09-19 DIAGNOSIS — I10 Essential (primary) hypertension: Secondary | ICD-10-CM

## 2016-09-19 NOTE — Telephone Encounter (Signed)
Called patient left message on patient voice mail regarding appt for refills on her medication. 30 day supply sent.

## 2016-09-19 NOTE — Telephone Encounter (Signed)
Received refill request for Hypertension med, pt is due for 6 month follow up. I have refilled x1. Please have her make her appt.

## 2016-09-27 ENCOUNTER — Ambulatory Visit (INDEPENDENT_AMBULATORY_CARE_PROVIDER_SITE_OTHER): Payer: BLUE CROSS/BLUE SHIELD | Admitting: Family Medicine

## 2016-09-27 ENCOUNTER — Encounter: Payer: Self-pay | Admitting: Family Medicine

## 2016-09-27 VITALS — BP 149/84 | HR 97 | Temp 98.3°F | Resp 18 | Ht 60.0 in | Wt 141.2 lb

## 2016-09-27 DIAGNOSIS — I10 Essential (primary) hypertension: Secondary | ICD-10-CM | POA: Diagnosis not present

## 2016-09-27 MED ORDER — LISINOPRIL 5 MG PO TABS: 5.0000 mg | ORAL_TABLET | Freq: Every day | ORAL | 0 refills | Status: DC

## 2016-09-27 NOTE — Patient Instructions (Addendum)
Stop HCTZ (current pill). Start Lisinopril once a day.  Nurse visit with in a week to recheck, must have taken lisinopril at least 2 hours prior to visit.    Low salt diet.

## 2016-09-27 NOTE — Progress Notes (Signed)
    Carla BobKimberly P Kelly , [birth year], 50 y.o., female MRN: [id number] Patient Care Team    Relationship Specialty Notifications Start End  Natalia Leatherwoodenee A Arnette Driggs, DO PCP - General Family Medicine  10/20/15     CC: hypertension follow up Subjective: Pt presents for  OV for follow up on her hypertension. She is eating a low salt diet. She denies chest pain, shortness of breath, lower extremity edema. She does not exercise. She reports compliance with her blood pressure medications.   No Known Allergies Social History  Substance Use Topics  . Smoking status: Current Every Day Smoker    Packs/day: 0.50    Types: Cigarettes  . Smokeless tobacco: Never Used  . Alcohol use Yes     Comment: daily; 3 beers a day   Past Medical History:  Diagnosis Date  . Anemia   . Frequent headaches   . History of blood transfusion   . Hypertension    Past Surgical History:  Procedure Laterality Date  . TUBAL LIGATION     Family History  Problem Relation Age of Onset  . Arthritis Mother   . COPD Father   . Cancer Neg Hx   . Heart disease Neg Hx      Medication List       Accurate as of 09/27/16  9:13 AM. Always use your most recent med list.          hydrochlorothiazide 50 MG tablet Commonly known as:  HYDRODIURIL TAKE 1 TABLET BY MOUTH DAILY       No results found for this or any previous visit (from the past 24 hour(s)). No results found.   ROS: Negative, with the exception of above mentioned in HPI   Objective:  BP (!) 149/84 (BP Location: Right Arm, Patient Position: Sitting, Cuff Size: Normal)   Pulse 97   Temp 98.3 F (36.8 C)   Resp 18   Ht 5' (1.524 m)   Wt 141 lb 4 oz (64.1 kg)   SpO2 96%   BMI 27.59 kg/m  Body mass index is 27.59 kg/m. Gen: Afebrile. No acute distress. Nontoxic in appearance, well developed, well nourished. Caucasian female.  HENT: AT. [place]. MMM Eyes:Pupils Equal Round Reactive to light, Extraocular movements intact,  Conjunctiva without redness,  discharge or icterus. CV: RRR , no murmur, no edema Chest: CTAB, no wheeze or crackles. Good air movement, normal resp effort.  Abd: Soft. NTND. BS present. Neuro:  Normal gait. PERLA. EOMi. Alert. Oriented x3   Assessment/Plan: Carla Phillips is a 50 y.o. female present for follow up OV for  Essential hypertension - Mildly elevated again today, pt reports compliance. She states she never had edema.  - Pt agreeable to lisinopril and prefers to take one pill if possible.  - DC Hctz, start lisinopril 5 mg. Pt will need a nurse visit within 1 week, to monitor BP after change. Pt advised to be seen at least 2 hours after taking medication. If needed will increase at that time. - low salt diet and exercise recommended.  - F/U 3 months, if BP normal at nurse visit in 1 week.    electronically signed by:  Felix Pacinienee Vianna Venezia, DO   [facility] - [place]

## 2016-10-04 ENCOUNTER — Ambulatory Visit (INDEPENDENT_AMBULATORY_CARE_PROVIDER_SITE_OTHER): Payer: BLUE CROSS/BLUE SHIELD | Admitting: *Deleted

## 2016-10-04 VITALS — BP 138/82

## 2016-10-04 DIAGNOSIS — I10 Essential (primary) hypertension: Secondary | ICD-10-CM | POA: Diagnosis not present

## 2016-10-04 NOTE — Progress Notes (Signed)
Electronically Signed by: Kaia Depaolis, DO [facility] [place]  

## 2016-10-04 NOTE — Progress Notes (Signed)
Patient presents today for blood pressure check per Dr. Claiborne BillingsKuneff. Blood pressure within normal parameters. Dr. Claiborne BillingsKuneff made aware. No new orders, patient to continue current regimen and to follow up in 3 months.

## 2016-10-07 ENCOUNTER — Other Ambulatory Visit: Payer: Self-pay | Admitting: Family Medicine

## 2016-10-07 ENCOUNTER — Ambulatory Visit (INDEPENDENT_AMBULATORY_CARE_PROVIDER_SITE_OTHER): Payer: BLUE CROSS/BLUE SHIELD | Admitting: Family Medicine

## 2016-10-07 ENCOUNTER — Encounter: Payer: Self-pay | Admitting: Family Medicine

## 2016-10-07 VITALS — BP 139/88 | HR 93 | Temp 98.5°F | Resp 20 | Wt 144.0 lb

## 2016-10-07 DIAGNOSIS — J0100 Acute maxillary sinusitis, unspecified: Secondary | ICD-10-CM | POA: Diagnosis not present

## 2016-10-07 MED ORDER — DOXYCYCLINE HYCLATE 100 MG PO TABS: 100.0000 mg | ORAL_TABLET | Freq: Two times a day (BID) | ORAL | 0 refills | Status: DC

## 2016-10-07 NOTE — Patient Instructions (Signed)
Doxycyline every 12 hours for 10 days. flonase daily Claritin daily mucinex plain .  rest and hydrate.   Sinusitis, Adult Sinusitis is redness, soreness, and inflammation of the paranasal sinuses. Paranasal sinuses are air pockets within the bones of your face. They are located beneath your eyes, in the middle of your forehead, and above your eyes. In healthy paranasal sinuses, mucus is able to drain out, and air is able to circulate through them by way of your nose. However, when your paranasal sinuses are inflamed, mucus and air can become trapped. This can allow bacteria and other germs to grow and cause infection. Sinusitis can develop quickly and last only a short time (acute) or continue over a long period (chronic). Sinusitis that lasts for more than 12 weeks is considered chronic. CAUSES Causes of sinusitis include:  Allergies.  Structural abnormalities, such as displacement of the cartilage that separates your nostrils (deviated septum), which can decrease the air flow through your nose and sinuses and affect sinus drainage.  Functional abnormalities, such as when the small hairs (cilia) that line your sinuses and help remove mucus do not work properly or are not present. SIGNS AND SYMPTOMS Symptoms of acute and chronic sinusitis are the same. The primary symptoms are pain and pressure around the affected sinuses. Other symptoms include:  Upper toothache.  Earache.  Headache.  Bad breath.  Decreased sense of smell and taste.  A cough, which worsens when you are lying flat.  Fatigue.  Fever.  Thick drainage from your nose, which often is green and may contain pus (purulent).  Swelling and warmth over the affected sinuses. DIAGNOSIS Your health care provider will perform a physical exam. During your exam, your health care provider may perform any of the following to help determine if you have acute sinusitis or chronic sinusitis:  Look in your nose for signs of  abnormal growths in your nostrils (nasal polyps).  Tap over the affected sinus to check for signs of infection.  View the inside of your sinuses using an imaging device that has a light attached (endoscope). If your health care provider suspects that you have chronic sinusitis, one or more of the following tests may be recommended:  Allergy tests.  Nasal culture. A sample of mucus is taken from your nose, sent to a lab, and screened for bacteria.  Nasal cytology. A sample of mucus is taken from your nose and examined by your health care provider to determine if your sinusitis is related to an allergy. TREATMENT Most cases of acute sinusitis are related to a viral infection and will resolve on their own within 10 days. Sometimes, medicines are prescribed to help relieve symptoms of both acute and chronic sinusitis. These may include pain medicines, decongestants, nasal steroid sprays, or saline sprays. However, for sinusitis related to a bacterial infection, your health care provider will prescribe antibiotic medicines. These are medicines that will help kill the bacteria causing the infection. Rarely, sinusitis is caused by a fungal infection. In these cases, your health care provider will prescribe antifungal medicine. For some cases of chronic sinusitis, surgery is needed. Generally, these are cases in which sinusitis recurs more than 3 times per year, despite other treatments. HOME CARE INSTRUCTIONS  Drink plenty of water. Water helps thin the mucus so your sinuses can drain more easily.  Use a humidifier.  Inhale steam 3-4 times a day (for example, sit in the bathroom with the shower running).  Apply a warm, moist washcloth to your  face 3-4 times a day, or as directed by your health care provider.  Use saline nasal sprays to help moisten and clean your sinuses.  Take medicines only as directed by your health care provider.  If you were prescribed either an antibiotic or antifungal  medicine, finish it all even if you start to feel better. SEEK IMMEDIATE MEDICAL CARE IF:  You have increasing pain or severe headaches.  You have nausea, vomiting, or drowsiness.  You have swelling around your face.  You have vision problems.  You have a stiff neck.  You have difficulty breathing.   This information is not intended to replace advice given to you by your health care provider. Make sure you discuss any questions you have with your health care provider.   Document Released: 12/12/2005 Document Revised: 01/02/2015 Document Reviewed: 12/27/2011 Elsevier Interactive Patient Education [employer].

## 2016-10-07 NOTE — Progress Notes (Signed)
    Carla BobKimberly P Schuchard , [birth year], 50 y.o., female MRN: [id number] Patient Care Team    Relationship Specialty Notifications Start End  Natalia Leatherwoodenee A Hamdan Toscano, DO PCP - General Family Medicine  10/20/15     CC: cough Subjective: Pt presents for an acute OV with complaints of cough of 5 days duration.  Associated symptoms include scratchy throat, nasal drainage, productive cough, felt feverish, sinus pressure, dizziness. She states the sore throat is worse at night.    No Known Allergies Social History  Substance Use Topics  . Smoking status: Current Every Day Smoker    Packs/day: 0.50    Types: Cigarettes  . Smokeless tobacco: Never Used  . Alcohol use Yes     Comment: daily; 3 beers a day   Past Medical History:  Diagnosis Date  . Anemia   . Frequent headaches   . History of blood transfusion   . Hypertension    Past Surgical History:  Procedure Laterality Date  . TUBAL LIGATION     Family History  Problem Relation Age of Onset  . Arthritis Mother   . COPD Father   . Cancer Neg Hx   . Heart disease Neg Hx      Medication List       Accurate as of 10/07/16  2:46 PM. Always use your most recent med list.          lisinopril 5 MG tablet Commonly known as:  PRINIVIL,ZESTRIL Take 1 tablet (5 mg total) by mouth daily.       No results found for this or any previous visit (from the past 24 hour(s)). No results found.   ROS: Negative, with the exception of above mentioned in HPI   Objective:  BP 139/88 (BP Location: Left Arm, Patient Position: Sitting, Cuff Size: Normal)   Pulse 93   Temp 98.5 F (36.9 C)   Resp 20   Wt 144 lb (65.3 kg)   SpO2 98%   BMI 28.12 kg/m  Body mass index is 28.12 kg/m. Gen: Afebrile. No acute distress. Nontoxic in appearance, well developed, well nourished.  HENT: AT. [place]. Bilateral TM visualized wnl. MMM, no oral lesions. Bilateral nares with erythema. Throat without erythema or exudates. Mild cough on exam. TTP max sinus.    Eyes:Pupils Equal Round Reactive to light, Extraocular movements intact,  Conjunctiva without redness, discharge or icterus. Neck/lymp/endocrine: Supple,no lymphadenopathy CV: RRR  Chest: CTAB, no wheeze or crackles. Good air movement, normal resp effort.  Abd: Soft. NTND. BS present. Skin: no rashes, purpura or petechiae.  Neuro:  Normal gait. PERLA. EOMi. Alert. Oriented x3   Assessment/Plan: Carla Phillips is a 50 y.o. female present for acute OV for  Acute maxillary sinusitis, recurrence not specified - Doxycyline , flonase ,claritin, mucinex plain .  - rest and hydrate.  - work excuse for today.  - F/u 2 weeks if not improved or worsening.   electronically signed by:  Felix Pacinienee Simranjit Thayer, DO  [facility] [place]

## 2016-10-07 NOTE — Progress Notes (Signed)
Received call from team health.  Doxycyline was not received at pharmacy.  Reviewed note from today which did state that she was going to send in doxycyline. eRx sent.

## 2016-10-17 ENCOUNTER — Other Ambulatory Visit: Payer: Self-pay | Admitting: Family Medicine

## 2016-10-17 DIAGNOSIS — I10 Essential (primary) hypertension: Secondary | ICD-10-CM

## 2016-10-28 ENCOUNTER — Telehealth: Payer: Self-pay | Admitting: Family Medicine

## 2016-10-28 MED ORDER — LISINOPRIL 5 MG PO TABS: 5.0000 mg | ORAL_TABLET | Freq: Every day | ORAL | 3 refills | Status: DC

## 2016-10-28 NOTE — Telephone Encounter (Signed)
Pt is calling for a refill of her lisinopril please. She is out of medication. Her last rx should have been for 3 months but was not and her follow up is not until January. [facility] request for HCT[facility] in error. Even though the HCTZ is not on her med list any long we filled it in error. Since she has had trouble with [facility] in the past she asks that we remove it from her pharmacy list and only send medications to [facility] now.

## 2016-11-12 ENCOUNTER — Other Ambulatory Visit: Payer: Self-pay | Admitting: Family Medicine

## 2016-11-12 DIAGNOSIS — I10 Essential (primary) hypertension: Secondary | ICD-10-CM

## 2016-11-26 DIAGNOSIS — B373 Candidiasis of vulva and vagina: Secondary | ICD-10-CM | POA: Diagnosis not present

## 2016-11-26 DIAGNOSIS — R05 Cough: Secondary | ICD-10-CM | POA: Diagnosis not present

## 2016-11-26 DIAGNOSIS — I10 Essential (primary) hypertension: Secondary | ICD-10-CM | POA: Diagnosis not present

## 2016-11-26 DIAGNOSIS — J0140 Acute pansinusitis, unspecified: Secondary | ICD-10-CM | POA: Diagnosis not present

## 2017-01-03 ENCOUNTER — Encounter: Payer: Self-pay | Admitting: Family Medicine

## 2017-01-03 ENCOUNTER — Ambulatory Visit (INDEPENDENT_AMBULATORY_CARE_PROVIDER_SITE_OTHER): Payer: BLUE CROSS/BLUE SHIELD | Admitting: Family Medicine

## 2017-01-03 VITALS — BP 149/78 | HR 90 | Temp 98.5°F | Resp 20 | Ht 60.0 in | Wt 148.0 lb

## 2017-01-03 DIAGNOSIS — I10 Essential (primary) hypertension: Secondary | ICD-10-CM | POA: Diagnosis not present

## 2017-01-03 NOTE — Progress Notes (Signed)
    Carla Phillips , Feb 03, 1966, 51 y.o., female MRN: [id number] Patient Care Team    Relationship Specialty Notifications Start End  Natalia Leatherwoodenee A Jabez Molner, DO PCP - General Family Medicine  10/20/15     CC: hypertension follow up Subjective: Pt presents for OV on hypertension. She reports compliance with lisinopril 5 mg QD. She does not monitor her BP at home. She denies chest pain, shortness of breath or LE edema. She does monitor the slat content in her diet. She has been on HCTZ 50 mg in the past without good control of BP and frequent urination was bothering her.   No Known Allergies Social History  Substance Use Topics  . Smoking status: Current Every Day Smoker    Packs/day: 0.50    Types: Cigarettes  . Smokeless tobacco: Never Used  . Alcohol use Yes     Comment: daily; 3 beers a day   Past Medical History:  Diagnosis Date  . Anemia   . Frequent headaches   . History of blood transfusion   . Hypertension    Past Surgical History:  Procedure Laterality Date  . TUBAL LIGATION     Family History  Problem Relation Age of Onset  . Arthritis Mother   . COPD Father   . Cancer Neg Hx   . Heart disease Neg Hx    Allergies as of 01/03/2017   No Known Allergies     Medication List       Accurate as of 01/03/17  3:08 PM. Always use your most recent med list.          lisinopril 5 MG tablet Commonly known as:  PRINIVIL,ZESTRIL Take 1 tablet (5 mg total) by mouth daily.       No results found for this or any previous visit (from the past 24 hour(s)). No results found.   ROS: Negative, with the exception of above mentioned in HPI   Objective:  BP (!) 149/78 (BP Location: Right Arm, Patient Position: Sitting, Cuff Size: Normal)   Pulse 90   Temp 98.5 F (36.9 C)   Resp 20   Ht 5' (1.524 m)   Wt 148 lb (67.1 kg)   SpO2 99%   BMI 28.90 kg/m  Body mass index is 28.9 kg/m.  Gen: Afebrile. No acute distress.  HENT: AT. [place].  MMM.  CV: RRR, no edema.  Chest:  CTAB, no wheeze or crackles Abd: Soft. NTND. BS present  Assessment/Plan: Carla Phillips is a 51 y.o. female present for follow up OV for  Essential hypertension - Again, mildly above goal. In attempt to stay on one medication, will increase lisinopril to 1.5 tabs (7.5 mg dose) QD.  - low salt diet, and encouraged exercise.  - nurse visit 3 days for recheck. - If BP normal, will call in lisinopril 7.5 mg QD dosing and f/u in 6 months.    electronically signed by:  Felix Pacinienee Rahma Meller, DO  [facility] [place]

## 2017-01-03 NOTE — Patient Instructions (Signed)
Increase lisinopril to 1.5 tabs daily.  Followup on Friday for nurse visit BP recheck.    Continue low salt diet.    If normal BP on recheck, will follow every 6 months.

## 2017-01-06 ENCOUNTER — Ambulatory Visit (INDEPENDENT_AMBULATORY_CARE_PROVIDER_SITE_OTHER): Payer: BLUE CROSS/BLUE SHIELD | Admitting: *Deleted

## 2017-01-06 ENCOUNTER — Other Ambulatory Visit: Payer: Self-pay | Admitting: Family Medicine

## 2017-01-06 VITALS — BP 148/90 | HR 90

## 2017-01-06 DIAGNOSIS — I10 Essential (primary) hypertension: Secondary | ICD-10-CM

## 2017-01-06 MED ORDER — LISINOPRIL 10 MG PO TABS: 10.0000 mg | ORAL_TABLET | Freq: Every day | ORAL | 0 refills | Status: DC

## 2017-01-06 NOTE — Progress Notes (Signed)
Increased Lisinopril to 10 mg once daily. Follow up in 2 weeks for HTN f/u to make sure doing well.

## 2017-01-06 NOTE — Progress Notes (Addendum)
Patient ID: Carla Phillips, female   DOB: 1966/09/24, 51 y.o.   MRN: [id number] Patient presents today for check on Blood Pressure.patient blood pressure still elevated above normal perimeters. Patient states she is taking her Lisinopril 5 mg 1 1/2 tabs daily. Per Dr Claiborne BillingsKuneff patient to increase her Lisinopril to 10 mg daily and follow up in 2 weeks . Patient verbalized understanding of all instructions.  Electronically Signed by: Carla Pacinienee Kuneff, DO [facility] [place]

## 2017-01-20 ENCOUNTER — Ambulatory Visit (INDEPENDENT_AMBULATORY_CARE_PROVIDER_SITE_OTHER): Payer: BLUE CROSS/BLUE SHIELD

## 2017-01-20 VITALS — BP 134/82

## 2017-01-20 DIAGNOSIS — I10 Essential (primary) hypertension: Secondary | ICD-10-CM

## 2017-01-20 MED ORDER — LISINOPRIL 10 MG PO TABS: 10.0000 mg | ORAL_TABLET | Freq: Every day | ORAL | 5 refills | Status: DC

## 2017-01-20 NOTE — Progress Notes (Addendum)
Patient presents today for blood pressure check per Dr. Claiborne BillingsKuneff.  Blood pressure within normal parameters. Dr. Claiborne BillingsKuneff aware. Patient to continue current regimen per Dr. Claiborne BillingsKuneff. Prescription sent to pharmacy requested.   BP 134/82 (BP Location: Left Arm, Patient Position: Sitting, Cuff Size: Normal)  Refills prescribed.  Electronically Signed by: Felix Pacinienee Kuneff, DO [facility] [place]

## 2017-01-20 NOTE — Addendum Note (Signed)
Addended by: Felix PaciniKUNEFF, Eddison Searls A on: 01/20/2017 12:08 PM   Modules accepted: Level of Service

## 2017-05-29 ENCOUNTER — Other Ambulatory Visit: Payer: Self-pay | Admitting: *Deleted

## 2017-05-29 MED ORDER — LISINOPRIL 10 MG PO TABS: 10.0000 mg | ORAL_TABLET | Freq: Every day | ORAL | 0 refills | Status: DC

## 2017-05-29 NOTE — Telephone Encounter (Signed)
Patient due for follow up appt on HTN. Sent in refill for 30 day supply. Left message for patient.

## 2017-08-07 ENCOUNTER — Other Ambulatory Visit: Payer: Self-pay | Admitting: *Deleted

## 2017-08-07 MED ORDER — LISINOPRIL 10 MG PO TABS: 10.0000 mg | ORAL_TABLET | Freq: Every day | ORAL | 0 refills | Status: DC

## 2017-08-07 NOTE — Telephone Encounter (Signed)
Left message patient needs office visit prior to anymore medication refills. Sent 14 day supply of lisinopril

## 2017-08-23 ENCOUNTER — Ambulatory Visit: Payer: BLUE CROSS/BLUE SHIELD | Admitting: Family Medicine

## 2017-08-24 ENCOUNTER — Telehealth: Payer: Self-pay | Admitting: Family Medicine

## 2017-08-24 NOTE — Telephone Encounter (Signed)
Patient only has 3 bp pills left. She had OV scheduled 08/23/17 but had to reschedule. She has rescheduled for 09/05/17. Can she get enough medication to make it until her next appt?

## 2017-08-25 MED ORDER — LISINOPRIL 10 MG PO TABS: 10.0000 mg | ORAL_TABLET | Freq: Every day | ORAL | 0 refills | Status: DC

## 2017-08-25 NOTE — Telephone Encounter (Signed)
14 day supply sent again. Patient will need to be seen prior to anymore refills.

## 2017-09-04 ENCOUNTER — Ambulatory Visit: Payer: BLUE CROSS/BLUE SHIELD | Admitting: Family Medicine

## 2017-09-05 ENCOUNTER — Ambulatory Visit (INDEPENDENT_AMBULATORY_CARE_PROVIDER_SITE_OTHER): Payer: BLUE CROSS/BLUE SHIELD | Admitting: Family Medicine

## 2017-09-05 ENCOUNTER — Telehealth: Payer: Self-pay | Admitting: Family Medicine

## 2017-09-05 ENCOUNTER — Encounter: Payer: Self-pay | Admitting: Family Medicine

## 2017-09-05 VITALS — BP 158/95 | HR 92 | Temp 98.4°F | Resp 20 | Ht 60.0 in | Wt 153.8 lb

## 2017-09-05 DIAGNOSIS — I10 Essential (primary) hypertension: Secondary | ICD-10-CM | POA: Diagnosis not present

## 2017-09-05 MED ORDER — LISINOPRIL 20 MG PO TABS: 20.0000 mg | ORAL_TABLET | Freq: Every day | ORAL | 0 refills | Status: DC

## 2017-09-05 NOTE — Patient Instructions (Signed)
Increase lisinopril to 20 mg a day.  Nurse visit next week for BP recheck. Followup with provider in 3 months.    LOW Sodium.    DASH Eating Plan DASH stands for "Dietary Approaches to Stop Hypertension." The DASH eating plan is a healthy eating plan that has been shown to reduce high blood pressure (hypertension). It may also reduce your risk for type 2 diabetes, heart disease, and stroke. The DASH eating plan may also help with weight loss. What are tips for following this plan? General guidelines  Avoid eating more than 2,300 mg (milligrams) of salt (sodium) a day. If you have hypertension, you may need to reduce your sodium intake to 1,500 mg a day.  Limit alcohol intake to no more than 1 drink a day for nonpregnant women and 2 drinks a day for men. One drink equals 12 oz of beer, 5 oz of wine, or 1 oz of hard liquor.  Work with your health care provider to maintain a healthy body weight or to lose weight. Ask what an ideal weight is for you.  Get at least 30 minutes of exercise that causes your heart to beat faster (aerobic exercise) most days of the week. Activities may include walking, swimming, or biking.  Work with your health care provider or diet and nutrition specialist (dietitian) to adjust your eating plan to your individual calorie needs. Reading food labels  Check food labels for the amount of sodium per serving. Choose foods with less than 5 percent of the Daily Value of sodium. Generally, foods with less than 300 mg of sodium per serving fit into this eating plan.  To find whole grains, look for the word "whole" as the first word in the ingredient list. Shopping  Buy products labeled as "low-sodium" or "no salt added."  Buy fresh foods. Avoid canned foods and premade or frozen meals. Cooking  Avoid adding salt when cooking. Use salt-free seasonings or herbs instead of table salt or sea salt. Check with your health care provider or pharmacist before using salt  substitutes.  Do not fry foods. Cook foods using healthy methods such as baking, boiling, grilling, and broiling instead.  Cook with heart-healthy oils, such as olive, canola, soybean, or sunflower oil. Meal planning   Eat a balanced diet that includes: ? 5 or more servings of fruits and vegetables each day. At each meal, try to fill half of your plate with fruits and vegetables. ? Up to 6-8 servings of whole grains each day. ? Less than 6 oz of lean meat, poultry, or fish each day. A 3-oz serving of meat is about the same size as a deck of cards. One egg equals 1 oz. ? 2 servings of low-fat dairy each day. ? A serving of nuts, seeds, or beans 5 times each week. ? Heart-healthy fats. Healthy fats called Omega-3 fatty acids are found in foods such as flaxseeds and coldwater fish, like sardines, salmon, and mackerel.  Limit how much you eat of the following: ? Canned or prepackaged foods. ? Food that is high in trans fat, such as fried foods. ? Food that is high in saturated fat, such as fatty meat. ? Sweets, desserts, sugary drinks, and other foods with added sugar. ? Full-fat dairy products.  Do not salt foods before eating.  Try to eat at least 2 vegetarian meals each week.  Eat more home-cooked food and less restaurant, buffet, and fast food.  When eating at a restaurant, ask that your food  be prepared with less salt or no salt, if possible. What foods are recommended? The items listed may not be a complete list. Talk with your dietitian about what dietary choices are best for you. Grains Whole-grain or whole-wheat bread. Whole-grain or whole-wheat pasta. Brown rice. [place]. Bulgur. Whole-grain and low-sodium cereals. Pita bread. Low-fat, low-sodium crackers. Whole-wheat flour tortillas. Vegetables Fresh or frozen vegetables (raw, steamed, roasted, or grilled). Low-sodium or reduced-sodium tomato and vegetable juice. Low-sodium or reduced-sodium tomato sauce and tomato  paste. Low-sodium or reduced-sodium canned vegetables. Fruits All fresh, dried, or frozen fruit. Canned fruit in natural juice (without added sugar). Meat and other protein foods Skinless chicken or [place]. Ground chicken or [place]. Pork with fat trimmed off. Fish and seafood. Egg whites. Dried beans, peas, or lentils. Unsalted nuts, nut butters, and seeds. Unsalted canned beans. Lean cuts of beef with fat trimmed off. Low-sodium, lean deli meat. Dairy Low-fat (1%) or fat-free (skim) milk. Fat-free, low-fat, or reduced-fat cheeses. Nonfat, low-sodium ricotta or cottage cheese. Low-fat or nonfat yogurt. Low-fat, low-sodium cheese. Fats and oils Soft margarine without trans fats. Vegetable oil. Low-fat, reduced-fat, or light mayonnaise and salad dressings (reduced-sodium). Canola, safflower, olive, soybean, and sunflower oils. Avocado. Seasoning and other foods Herbs. Spices. Seasoning mixes without salt. Unsalted popcorn and pretzels. Fat-free sweets. What foods are not recommended? The items listed may not be a complete list. Talk with your dietitian about what dietary choices are best for you. Grains Baked goods made with fat, such as croissants, muffins, or some breads. Dry pasta or rice meal packs. Vegetables Creamed or fried vegetables. Vegetables in a cheese sauce. Regular canned vegetables (not low-sodium or reduced-sodium). Regular canned tomato sauce and paste (not low-sodium or reduced-sodium). Regular tomato and vegetable juice (not low-sodium or reduced-sodium). Angie Fava. Olives. Fruits Canned fruit in a light or heavy syrup. Fried fruit. Fruit in cream or butter sauce. Meat and other protein foods Fatty cuts of meat. Ribs. Fried meat. Berniece Salines. Sausage. Bologna and other processed lunch meats. Salami. Fatback. Hotdogs. Bratwurst. Salted nuts and seeds. Canned beans with added salt. Canned or smoked fish. Whole eggs or egg yolks. Chicken or [place] with skin. Dairy Whole or 2% milk,  cream, and half-and-half. Whole or full-fat cream cheese. Whole-fat or sweetened yogurt. Full-fat cheese. Nondairy creamers. Whipped toppings. Processed cheese and cheese spreads. Fats and oils Butter. Stick margarine. Lard. Shortening. Ghee. Bacon fat. Tropical oils, such as coconut, palm kernel, or palm oil. Seasoning and other foods Salted popcorn and pretzels. Onion salt, garlic salt, seasoned salt, table salt, and sea salt. Worcestershire sauce. Tartar sauce. Barbecue sauce. Teriyaki sauce. Soy sauce, including reduced-sodium. Steak sauce. Canned and packaged gravies. Fish sauce. Oyster sauce. Cocktail sauce. Horseradish that you find on the shelf. Ketchup. Mustard. Meat flavorings and tenderizers. Bouillon cubes. Hot sauce and Tabasco sauce. Premade or packaged marinades. Premade or packaged taco seasonings. Relishes. Regular salad dressings. Where to find more information:  National Heart, Lung, and Diamond: https://wilson-eaton.com/  American Heart Association: www.heart.org Summary  The DASH eating plan is a healthy eating plan that has been shown to reduce high blood pressure (hypertension). It may also reduce your risk for type 2 diabetes, heart disease, and stroke.  With the DASH eating plan, you should limit salt (sodium) intake to 2,300 mg a day. If you have hypertension, you may need to reduce your sodium intake to 1,500 mg a day.  When on the DASH eating plan, aim to eat more fresh fruits and vegetables, whole  grains, lean proteins, low-fat dairy, and heart-healthy fats.  Work with your health care provider or diet and nutrition specialist (dietitian) to adjust your eating plan to your individual calorie needs. This information is not intended to replace advice given to you by your health care provider. Make sure you discuss any questions you have with your health care provider. Document Released: 12/01/2011 Document Revised: 12/05/2016 Document Reviewed: 12/05/2016 Elsevier  Interactive Patient Education  2017 [employer].

## 2017-09-05 NOTE — Progress Notes (Signed)
ANDREIA GANDOLFI , 1966/12/02, 51 y.o., female MRN: [id number] Patient Care Team    Relationship Specialty Notifications Start End  Natalia Leatherwood, DO PCP - General Family Medicine  10/20/15    Chief Complaint  Patient presents with  . Hypertension    Subjective:  Pt reports compliance with Lisinopril 10 mg daily. Blood pressures ranges at home not checked. Patient denies chest pain, shortness of breath or lower extremity edema. Pt does not take a daily baby ASA. Pt is not prescribed statin. Patient had been on HCTZ 50 mg daily in the past. She never had good control of her blood pressure. She had frequent urination which bothered her. BMP: 10/28/2015 GFR normal CBC: 10/20/2015, within normal limits Lipids: 10/28/2015 total cholesterol 166, HDL 78, LDL 71, triglycerides 81 Diet: Does not watch the sodium in her diet very closely. Exercise: Does not exercise routinely RF: Hypertension, obesity, smoker    No Known Allergies Social History  Substance Use Topics  . Smoking status: Current Every Day Smoker    Packs/day: 0.50    Types: Cigarettes  . Smokeless tobacco: Never Used  . Alcohol use Yes     Comment: daily; 3 beers a day   Past Medical History:  Diagnosis Date  . Anemia   . Frequent headaches   . History of blood transfusion   . Hypertension    Past Surgical History:  Procedure Laterality Date  . TUBAL LIGATION     Family History  Problem Relation Age of Onset  . Arthritis Mother   . COPD Father   . Cancer Neg Hx   . Heart disease Neg Hx    Allergies as of 09/05/2017   No Known Allergies     Medication List       Accurate as of 09/05/17  5:47 PM. Always use your most recent med list.          lisinopril 20 MG tablet Commonly known as:  PRINIVIL,ZESTRIL Take 1 tablet (20 mg total) by mouth daily. Patient needs office visit prior to anymore refills.            Discharge Care Instructions        Start     Ordered   09/05/17 0000   lisinopril (PRINIVIL,ZESTRIL) 20 MG tablet  Daily     09/05/17 1531      No results found for this or any previous visit (from the past 24 hour(s)). No results found.   ROS: Negative, with the exception of above mentioned in HPI   Objective:  BP (!) 158/95 (BP Location: Right Arm, Patient Position: Sitting, Cuff Size: Normal)   Pulse 92   Temp 98.4 F (36.9 C)   Resp 20   Ht 5' (1.524 m)   Wt 153 lb 12 oz (69.7 kg)   SpO2 96%   BMI 30.03 kg/m  Body mass index is 30.03 kg/m.  Gen: Afebrile. No acute distress. Nontoxic in appearance, well-developed, well-nourished, Caucasian female. HENT: AT. [place].  MMM.  Eyes:Pupils Equal Round Reactive to light, Extraocular movements intact,  Conjunctiva without redness, discharge or icterus. Neck/lymp/endocrine: Supple, no lymphadenopathy, no thyromegaly CV: RRR no murmur, no edema, +2/4 P posterior tibialis pulses Chest: CTAB, no wheeze or crackles Abd: Soft. NTND. BS present. No Masses palpated.  Skin: No rashes, purpura or petechiae.  Neuro:  Normal gait. PERLA. EOMi. Alert. Oriented.   Assessment/Plan: ANALIYAH LECHUGA is a 51 y.o. female present for follow up OV for  Essential hypertension - Above goal again today despite increases after last visit. Patient admits to eating what sounds like a high sodium content diet. She does not have any edema today. Discussed options with her, will increase lisinopril to 20 mg daily. She will follow-up in one week with the nurse visit to recheck blood pressure. We will also order future labs to check kidney function with increase of lisinopril, CBC and TSH with increasing BP despite medication increases.  - increase lisinopril to 20 mg QD today.  - nurse visit next week for BP check.  - AVS on low salt diet, and encouraged exercise.  - F/U 3 months with provider.    electronically signed by:  Felix Pacinienee Kuneff, DO  [facility] [place]

## 2017-09-05 NOTE — Telephone Encounter (Signed)
Please call patient: She was asked to schedule an appointment for a nurse visit to recheck her blood pressure next week. I have also put in labs for her to get collected at that time. please  make patient aware we are going to collect labs (CBC, BMP and TSH given her increasing in blood pressure and increase in lisinopril), and also make sure she has a lab appointment scheduled as well as a nurse visit next week.

## 2017-09-06 NOTE — Telephone Encounter (Signed)
Spoke with patient reviewed information. Patient verbalized understanding 

## 2017-09-13 ENCOUNTER — Encounter: Payer: Self-pay | Admitting: Family Medicine

## 2017-09-13 ENCOUNTER — Other Ambulatory Visit: Payer: BLUE CROSS/BLUE SHIELD

## 2017-09-13 ENCOUNTER — Ambulatory Visit: Payer: BLUE CROSS/BLUE SHIELD

## 2017-09-13 ENCOUNTER — Ambulatory Visit (INDEPENDENT_AMBULATORY_CARE_PROVIDER_SITE_OTHER): Payer: BLUE CROSS/BLUE SHIELD | Admitting: Family Medicine

## 2017-09-13 VITALS — BP 150/88 | HR 100 | Temp 98.5°F | Resp 20 | Wt 151.2 lb

## 2017-09-13 DIAGNOSIS — I10 Essential (primary) hypertension: Secondary | ICD-10-CM | POA: Diagnosis not present

## 2017-09-13 DIAGNOSIS — J320 Chronic maxillary sinusitis: Secondary | ICD-10-CM

## 2017-09-13 MED ORDER — AMOXICILLIN-POT CLAVULANATE 875-125 MG PO TABS: 1.0000 | ORAL_TABLET | Freq: Two times a day (BID) | ORAL | 0 refills | Status: DC

## 2017-09-13 NOTE — Patient Instructions (Addendum)
Increase lisinopril to 1.5 tabs daily, for 1 week with nurse visit for recheck. This will allow time to get over sinus issues as well.   Augmentin prescribed for sinus.   If still above 130ish/ 80 will likely need to go to a total of 40 mg.   Low sodium.

## 2017-09-13 NOTE — Progress Notes (Signed)
Carla Phillips , 09-28-1966, 51 y.o., female MRN: [id number] Patient Care Team    Relationship Specialty Notifications Start End  Natalia Leatherwood, DO PCP - General Family Medicine  10/20/15    Chief Complaint  Patient presents with  . URI    cough,congestion x 5 days  . Hypertension    Subjective:  Hypertension: Pt reports compliance with Lisinopril 20 mg daily. Blood pressures ranges at home not checked. Patient denies chest pain, shortness of breath, dizziness or lower extremity edema. Pt does not take a daily baby ASA. Pt is not prescribed statin. Patient had been on HCTZ 50 mg daily in the past. She never had good control of her blood pressure. She had frequent urination which bothered her.  BMP: 10/28/2015 GFR normal--> collected today CBC: 10/20/2015, within normal limits--> collected today Lipids: 10/28/2015 total cholesterol 166, HDL 78, LDL 71, triglycerides 81 Diet: Does not watch the sodium in her diet very closely. Exercise: Does not exercise routinely RF: Hypertension, obesity, smoker   Sinusitis: started about 1 week ago. No fever,chills, nausea, vomit or rash. Left side sinus pressure. Tried Allegra, deslym, mucinex, Nasal saline and flonase. Symptoms are worsening, now with sinus pain left max sinus.   No Known Allergies Social History  Substance Use Topics  . Smoking status: Current Every Day Smoker    Packs/day: 0.50    Types: Cigarettes  . Smokeless tobacco: Never Used  . Alcohol use Yes     Comment: daily; 3 beers a day   Past Medical History:  Diagnosis Date  . Anemia   . Frequent headaches   . History of blood transfusion   . Hypertension    Past Surgical History:  Procedure Laterality Date  . TUBAL LIGATION     Family History  Problem Relation Age of Onset  . Arthritis Mother   . COPD Father   . Cancer Neg Hx   . Heart disease Neg Hx    Allergies as of 09/13/2017   No Known Allergies     Medication List       Accurate as of  09/13/17  2:46 PM. Always use your most recent med list.          amoxicillin-clavulanate 875-125 MG tablet Commonly known as:  AUGMENTIN Take 1 tablet by mouth 2 (two) times daily.   lisinopril 20 MG tablet Commonly known as:  PRINIVIL,ZESTRIL Take 1 tablet (20 mg total) by mouth daily. Patient needs office visit prior to anymore refills.            Discharge Care Instructions        Start     Ordered   09/13/17 0000  amoxicillin-clavulanate (AUGMENTIN) 875-125 MG tablet  2 times daily     09/13/17 1440      No results found for this or any previous visit (from the past 24 hour(s)). No results found.   ROS: Negative, with the exception of above mentioned in HPI  Objective:  BP (!) 150/88 (BP Location: Right Arm, Patient Position: Sitting, Cuff Size: Normal)   Pulse 100   Temp 98.5 F (36.9 C)   Resp 20   Wt 151 lb 4 oz (68.6 kg)   SpO2 97%   BMI 29.54 kg/m  Body mass index is 29.54 kg/m.  Gen: Afebrile. No acute distress. Skin appearance, well-developed, well-nourished Caucasian female. HENT: AT. [place]. Bilateral TM visualized mild fullness, no erythema. MMM. Bilateral nares erythema and drainage present. Throat without erythema or  exudates. No cough, mild hoarseness. Eyes:Pupils Equal Round Reactive to light, Extraocular movements intact,  Conjunctiva without redness, discharge or icterus. Neck/lymp/endocrine: Supple, no lymphadenopathy, no thyromegaly CV: RRR no murmur, no edema, +2/4 P posterior tibialis pulses Chest: CTAB, no wheeze or crackles Abd: Soft. NTND. BS present. No Masses palpated.  Neuro:  Normal gait. PERLA. EOMi. Alert. Oriented.    Assessment/Plan: Carla Phillips is a 51 y.o. female present for follow up OV for  Essential hypertension - again not at goal today. Increased to lisinopril 30 mg QD, nurse visit in 1 week.   - If above 135/85 and nurse visit we'll increase to lisinopril 40 mg daily Follow-up in 4 weeks. - CBC, BMP and TSH  collected today. - Patient encouraged to quit smoking. - Low-sodium diet, increase exercise greater than 150 minutes a week. - 1 week for nurse visit, his blood pressure normal at that visit we'll follow-up in 3 months. If abnormal will follow-up in 4 weeks after increase of lisinopril again.  Sinusitis: -Rest, hydrate, Flonase, Mucinex DM. Try Zyrtec instead of Allegra. Continue nasal saline flushes. Appears infected today, will treat with Augmentin twice a day 10 days. Follow-up when necessary   electronically signed by:  Felix Pacini, DO  [facility] [place]

## 2017-09-14 LAB — BASIC METABOLIC PANEL WITH GFR
BUN: 16 mg/dL (ref 7–25)
CALCIUM: 9.4 mg/dL (ref 8.6–10.4)
CHLORIDE: 106 mmol/L (ref 98–110)
CO2: 22 mmol/L (ref 20–32)
Creat: 0.83 mg/dL (ref 0.50–1.05)
GFR, EST AFRICAN AMERICAN: 95 mL/min/{1.73_m2} (ref >=60)
GFR, Est Non African American: 82 mL/min/{1.73_m2} (ref >=60)
GLUCOSE: 109 mg/dL — AB (ref 65–99)
POTASSIUM: 4.1 mmol/L (ref 3.5–5.3)
Sodium: 138 mmol/L (ref 135–146)

## 2017-09-14 LAB — CBC
HCT: 41.8 % (ref 35.0–45.0)
HEMOGLOBIN: 14.6 g/dL (ref 11.7–15.5)
MCH: 33.4 pg — ABNORMAL HIGH (ref 27.0–33.0)
MCHC: 34.9 g/dL (ref 32.0–36.0)
MCV: 95.7 fL (ref 80.0–100.0)
MPV: 9.6 fL (ref 7.5–12.5)
PLATELETS: 292 10*3/uL (ref 140–400)
RBC: 4.37 10*6/uL (ref 3.80–5.10)
RDW: 11.9 % (ref 11.0–15.0)
WBC: 9 10*3/uL (ref 3.8–10.8)

## 2017-09-14 LAB — TSH: TSH: 0.68 m[IU]/L

## 2017-09-20 ENCOUNTER — Ambulatory Visit (INDEPENDENT_AMBULATORY_CARE_PROVIDER_SITE_OTHER): Payer: BLUE CROSS/BLUE SHIELD | Admitting: *Deleted

## 2017-09-20 VITALS — BP 136/84 | HR 86

## 2017-09-20 DIAGNOSIS — I10 Essential (primary) hypertension: Secondary | ICD-10-CM | POA: Diagnosis not present

## 2017-09-20 MED ORDER — LISINOPRIL 40 MG PO TABS: 40.0000 mg | ORAL_TABLET | Freq: Every day | ORAL | 0 refills | Status: DC

## 2017-09-20 NOTE — Progress Notes (Addendum)
Patient presents today for Blood Pressure check as ordered by Dr Claiborne Billings. Patient states she is taking( 2) 20 mg tablets daily of her Lisinopril at this time.Per Dr Claiborne Billings patient to remain on Lisinopril 40 mg daily. BP 136/84   Pulse 86    Electronically Signed by: Felix Pacini, DO [facility] [place]

## 2017-09-20 NOTE — Addendum Note (Signed)
Addended by: Felix Pacini A on: 09/20/2017 03:24 PM   Modules accepted: Orders

## 2017-10-02 ENCOUNTER — Telehealth: Payer: Self-pay | Admitting: Family Medicine

## 2017-10-02 MED ORDER — FLUCONAZOLE 150 MG PO TABS: 150.0000 mg | ORAL_TABLET | Freq: Once | ORAL | 0 refills | Status: AC

## 2017-10-02 NOTE — Telephone Encounter (Signed)
Patient has a vaginal yeast infection as a result of taking antibiotics. Can she get an Rx for it sent to [facility]?

## 2017-10-02 NOTE — Telephone Encounter (Signed)
Diflucan prescribed

## 2017-10-02 NOTE — Telephone Encounter (Signed)
Patient notified

## 2017-10-02 NOTE — Telephone Encounter (Signed)
Seen 09/13/17 Rx'd antibiotics for sinus infection.

## 2017-10-25 ENCOUNTER — Telehealth: Payer: Self-pay | Admitting: Family Medicine

## 2017-10-25 MED ORDER — LISINOPRIL 40 MG PO TABS: 40.0000 mg | ORAL_TABLET | Freq: Every day | ORAL | 0 refills | Status: DC

## 2017-10-25 NOTE — Telephone Encounter (Signed)
Patient calling to check status of refill request for lisinopril (PRINIVIL,ZESTRIL) 40 MG tablet.  States pharmacy has not received.  Please call patient back at [phone] to follow-up.

## 2017-12-05 ENCOUNTER — Ambulatory Visit: Payer: BLUE CROSS/BLUE SHIELD | Admitting: Family Medicine

## 2017-12-29 ENCOUNTER — Ambulatory Visit: Payer: BLUE CROSS/BLUE SHIELD | Admitting: Family Medicine

## 2018-01-08 ENCOUNTER — Encounter: Payer: Self-pay | Admitting: Family Medicine

## 2018-01-08 ENCOUNTER — Ambulatory Visit: Payer: BLUE CROSS/BLUE SHIELD | Admitting: Family Medicine

## 2018-01-08 VITALS — BP 122/77 | HR 95 | Temp 98.4°F | Resp 20 | Wt 160.0 lb

## 2018-01-08 DIAGNOSIS — E669 Obesity, unspecified: Secondary | ICD-10-CM | POA: Insufficient documentation

## 2018-01-08 DIAGNOSIS — Z720 Tobacco use: Secondary | ICD-10-CM | POA: Diagnosis not present

## 2018-01-08 DIAGNOSIS — I10 Essential (primary) hypertension: Secondary | ICD-10-CM | POA: Diagnosis not present

## 2018-01-08 DIAGNOSIS — E663 Overweight: Secondary | ICD-10-CM | POA: Insufficient documentation

## 2018-01-08 MED ORDER — LISINOPRIL 40 MG PO TABS: 40.0000 mg | ORAL_TABLET | Freq: Every day | ORAL | 1 refills | Status: DC

## 2018-01-08 NOTE — Progress Notes (Signed)
    Carla Phillips , [birth year], 52 y.o., female Carla BobMRN: [id number] Patient Care Team    Relationship Specialty Notifications Start End  Natalia LeatherwoodKuneff, Renee A, DO PCP - General Family Medicine  10/20/15    Chief Complaint  Patient presents with  . Hypertension    Subjective:  Hypertension/obesity: Pt reports compliance with Lisinopril 40 mg daily. Blood pressures ranges at home not checked. Patient denies chest pain, shortness of breath, dizziness or lower extremity edema. Pt does not take a daily baby ASA. Pt is not prescribed statin. Patient had been on HCTZ 50 mg daily in the past. She never had good control of her blood pressure. She had frequent urination which bothered her.  BMP: 09/13/2017 GFR normal CBC: 09/13/2017, within normal limits TSH: 09/13/2017 WNL Lipids: 10/28/2015 total cholesterol 166, HDL 78, LDL 71, triglycerides 81 Diet: Does not watch the sodium in her diet very closely. Exercise: Does not exercise routinely RF: Hypertension, obesity, smoker  No Known Allergies Social History   Tobacco Use  . Smoking status: Current Every Day Smoker    Packs/day: 0.50    Types: Cigarettes  . Smokeless tobacco: Never Used  Substance Use Topics  . Alcohol use: Yes    Comment: daily; 3 beers a day   Past Medical History:  Diagnosis Date  . Anemia   . Frequent headaches   . History of blood transfusion   . Hypertension    Past Surgical History:  Procedure Laterality Date  . TUBAL LIGATION     Family History  Problem Relation Age of Onset  . Arthritis Mother   . COPD Father   . Cancer Neg Hx   . Heart disease Neg Hx    Allergies as of 01/08/2018   No Known Allergies     Medication List        Accurate as of 01/08/18  2:35 PM. Always use your most recent med list.          lisinopril 40 MG tablet Commonly known as:  PRINIVIL,ZESTRIL Take 1 tablet (40 mg total) by mouth daily.       No results found for this or any previous visit (from the past 24  hour(s)). No results found.   ROS: Negative, with the exception of above mentioned in HPI  Objective:  BP 122/77 (BP Location: Right Arm, Patient Position: Sitting, Cuff Size: Normal)   Pulse 95   Temp 98.4 F (36.9 C)   Resp 20   Wt 160 lb (72.6 kg)   SpO2 97%   BMI 31.25 kg/m  Body mass index is 31.25 kg/m.  Gen: Afebrile. No acute distress. Nontoxic in appearance, well developed  HENT: AT. [place]. MMM.  Eyes:Pupils Equal Round Reactive to light, Extraocular movements intact,  Conjunctiva without redness, discharge or icterus. CV: RRR no murmur, no edema Chest: CTAB, no wheeze or crackles Neuro:  Normal gait. PERLA. EOMi. Alert. Oriented x3   Assessment/Plan: Carla Phillips is a 52 y.o. female present for follow up OV for  Essential hypertension/obesity/tobacco abuse - Stable. Continue  lisinopril 40 mg QD. Refills provided for 6 months - Monitor at home If above 135/85 routinely, pt will call in to be seen sooner.  - Patient encouraged to quit smoking. - Low-sodium diet, increase exercise greater than 150 minutes a week. - F/U 6 months.   electronically signed by:  Felix Pacinienee Kuneff, DO  [facility] [place]

## 2018-01-08 NOTE — Patient Instructions (Signed)
Your Blood pressure looks great today!  I have refilled your meds for 6 months.  Follow up 6 months.

## 2018-02-10 DIAGNOSIS — I10 Essential (primary) hypertension: Secondary | ICD-10-CM | POA: Diagnosis not present

## 2018-02-10 DIAGNOSIS — J0140 Acute pansinusitis, unspecified: Secondary | ICD-10-CM | POA: Diagnosis not present

## 2018-03-06 DIAGNOSIS — M722 Plantar fascial fibromatosis: Secondary | ICD-10-CM | POA: Diagnosis not present

## 2018-03-06 DIAGNOSIS — M71571 Other bursitis, not elsewhere classified, right ankle and foot: Secondary | ICD-10-CM | POA: Diagnosis not present

## 2018-03-06 DIAGNOSIS — M7731 Calcaneal spur, right foot: Secondary | ICD-10-CM | POA: Diagnosis not present

## 2018-03-06 DIAGNOSIS — M76821 Posterior tibial tendinitis, right leg: Secondary | ICD-10-CM | POA: Diagnosis not present

## 2018-03-13 DIAGNOSIS — M71571 Other bursitis, not elsewhere classified, right ankle and foot: Secondary | ICD-10-CM | POA: Diagnosis not present

## 2018-03-13 DIAGNOSIS — M722 Plantar fascial fibromatosis: Secondary | ICD-10-CM | POA: Diagnosis not present

## 2018-07-11 ENCOUNTER — Other Ambulatory Visit: Payer: Self-pay

## 2018-07-11 MED ORDER — LISINOPRIL 40 MG PO TABS: 40.0000 mg | ORAL_TABLET | Freq: Every day | ORAL | 0 refills | Status: DC

## 2018-08-09 ENCOUNTER — Other Ambulatory Visit: Payer: Self-pay | Admitting: Family Medicine

## 2018-08-29 ENCOUNTER — Encounter: Payer: Self-pay | Admitting: Family Medicine

## 2018-08-29 ENCOUNTER — Ambulatory Visit: Payer: BLUE CROSS/BLUE SHIELD | Admitting: Family Medicine

## 2018-08-29 VITALS — BP 146/84 | HR 100 | Temp 98.6°F | Resp 20 | Ht 60.0 in | Wt 160.0 lb

## 2018-08-29 DIAGNOSIS — I10 Essential (primary) hypertension: Secondary | ICD-10-CM | POA: Diagnosis not present

## 2018-08-29 MED ORDER — LISINOPRIL 40 MG PO TABS: 40.0000 mg | ORAL_TABLET | Freq: Every day | ORAL | 1 refills | Status: DC

## 2018-08-29 MED ORDER — AMLODIPINE BESYLATE 2.5 MG PO TABS: 2.5000 mg | ORAL_TABLET | Freq: Every day | ORAL | 1 refills | Status: DC

## 2018-08-29 NOTE — Progress Notes (Signed)
Carla Phillips , April 24, 1966, 52 y.o., female MRN: [id number] Patient Care Team    Relationship Specialty Notifications Start End  Natalia Leatherwood, DO PCP - General Family Medicine  10/20/15    Chief Complaint  Patient presents with  . Hypertension    Subjective:  Hypertension/obesity: Pt reports compliance with Lisinopril 40 mg daily. Blood pressures ranges at home not checked. Patient denies chest pain, shortness of breath, dizziness or lower extremity edema. Pt does not take a daily baby ASA. Pt is not prescribed statin. Patient had been on HCTZ 50 mg daily in the past. She never had good control of her blood pressure. She had frequent urination which bothered her.  BMP: 09/13/2017 GFR normal CBC: 09/13/2017, within normal limits TSH: 09/13/2017 WNL Lipids: 10/28/2015 total cholesterol 166, HDL 78, LDL 71, triglycerides 81 Diet: Does not watch the sodium in her diet very closely. Exercise: Does not exercise routinely RF: Hypertension, obesity, smoker  No Known Allergies Social History   Tobacco Use  . Smoking status: Current Every Day Smoker    Packs/day: 0.50    Types: Cigarettes  . Smokeless tobacco: Never Used  Substance Use Topics  . Alcohol use: Yes    Comment: daily; 3 beers a day   Past Medical History:  Diagnosis Date  . Anemia   . Frequent headaches   . History of blood transfusion   . Hypertension    Past Surgical History:  Procedure Laterality Date  . TUBAL LIGATION     Family History  Problem Relation Age of Onset  . Arthritis Mother   . COPD Father   . Cancer Neg Hx   . Heart disease Neg Hx    Allergies as of 08/29/2018   No Known Allergies     Medication List        Accurate as of 08/29/18  3:55 PM. Always use your most recent med list.          lisinopril 40 MG tablet Commonly known as:  PRINIVIL,ZESTRIL TAKE 1 TABLET BY MOUTH DAILY. PATIENT NEEDS FOLLOW UP APPOINTMENT FOR FURTHER REFILLS.       No results found for this or any  previous visit (from the past 24 hour(s)). No results found.   ROS: Negative, with the exception of above mentioned in HPI  Objective:  BP (!) 146/84 (BP Location: Right Arm, Patient Position: Sitting, Cuff Size: Normal)   Pulse 100   Temp 98.6 F (37 C)   Resp 20   Ht 5' (1.524 m)   Wt 160 lb (72.6 kg)   SpO2 98%   BMI 31.25 kg/m  Body mass index is 31.25 kg/m.  Gen: Afebrile. No acute distress. Nontoxic, obese pleasant female.  HENT: AT. [place].MMM.  Eyes:Pupils Equal Round Reactive to light, Extraocular movements intact,  Conjunctiva without redness, discharge or icterus. Neck/lymp/endocrine: Supple,no lymphadenopathy, no thyromegaly CV: RRR no murmur, no edema, +2/4 P posterior tibialis pulses Chest: CTAB, no wheeze or crackles Abd: Soft. NTND. BS present. no Masses palpated.  Skin: no rashes, purpura or petechiae.  Neuro:  Normal gait. PERLA. EOMi. Alert. Oriented x3  Psych: Normal affect, dress and demeanor. Normal speech. Normal thought content and judgment.  Assessment/Plan: KANOELANI KEMMERLIN is a 52 y.o. female present for follow up OV for  Essential hypertension/obesity/tobacco abuse - Elevated above goal.  -  Continue  lisinopril 40 mg QD. Refills provided for 6 months - Added amlodipine 2.5 mg a day. --> can schedule nurse visit  in 2-3 weeks for BP recheck.  - labs collected today: CBC, BMP, lipid, TSH - Low-sodium diet, increase exercise greater than 150 minutes a week. - F/U 6 months with provider.   Orders Placed This Encounter  Procedures  . Basic Metabolic Panel (BMET)    Order Specific Question:   Has the patient fasted?    Answer:   No  . Lipid panel    Order Specific Question:   Has the patient fasted?    Answer:   No  . CBC w/Diff  . TSH     electronically signed by:  Felix Pacini, DO  [facility] - [place]

## 2018-08-29 NOTE — Patient Instructions (Addendum)
Continue lisinopril 40 mg a day.  Start amlodipine 2.5 mg a day.  Followup in 6 months on blood pressure.  Goal BP <135/80  Low sodium diet and exercise will help also.   Please help [place] help you:  We are honored you have chosen [facility] for your Primary Care home. Below you will find basic instructions that you may need to access in the future. Please help [place] help you by reading the instructions, which cover many of the frequent questions we experience.   Prescription refills and request:  -In order to allow more efficient response time, please call your pharmacy for all refills. They will forward the request electronically to [place]. This allows for the quickest possible response. Request left on a nurse line can take longer to refill, since these are checked as time allows between office patients and other phone calls.  - refill request can take up to 3-5 working days to complete.  - If request is sent electronically and request is appropiate, it is usually completed in 1-2 business days.  - all patients will need to be seen routinely for all chronic medical conditions requiring prescription medications (see follow-up below). If you are overdue for follow up on your condition, you will be asked to make an appointment and we will call in enough medication to cover you until your appointment (up to 30 days).  - all controlled substances will require a face to face visit to request/refill.  - if you desire your prescriptions to go through a new pharmacy, and have an active script at original pharmacy, you will need to call your pharmacy and have scripts transferred to new pharmacy. This is completed between the pharmacy locations and not by your provider.    Results: If any images or labs were ordered, it can take up to 1 week to get results depending on the test ordered and the lab/facility running and resulting the test. - Normal or stable results, which do not need further discussion, may  be released to your mychart immediately with attached note to you. A call may not be generated for normal results. Please make certain to sign up for mychart. If you have questions on how to activate your mychart you can call the front office.  - If your results need further discussion, our office will attempt to contact you via phone, and if unable to reach you after 2 attempts, we will release your abnormal result to your mychart with instructions.  - All results will be automatically released in mychart after 1 week.  - Your provider will provide you with explanation and instruction on all relevant material in your results. Please keep in mind, results and labs may appear confusing or abnormal to the untrained eye, but it does not mean they are actually abnormal for you personally. If you have any questions about your results that are not covered, or you desire more detailed explanation than what was provided, you should make an appointment with your provider to do so.   Our office handles many outgoing and incoming calls daily. If we have not contacted you within 1 week about your results, please check your mychart to see if there is a message first and if not, then contact our office.  In helping with this matter, you help decrease call volume, and therefore allow [place] to be able to respond to patients needs more efficiently.   Acute office visits (sick visit):  An acute visit is intended  for a new problem and are scheduled in shorter time slots to allow schedule openings for patients with new problems. This is the appropriate visit to discuss a new problem. Problems will not be addressed by phone call or Echart message. Appointment is needed if requesting treatment. In order to provide you with excellent quality medical care with proper time for you to explain your problem, have an exam and receive treatment with instructions, these appointments should be limited to one new problem per visit. If you  experience a new problem, in which you desire to be addressed, please make an acute office visit, we save openings on the schedule to accommodate you. Please do not save your new problem for any other type of visit, let us take care of it properly and quickly for you.   Follow up visits:  Depending on your condition(s) your provider will need to see you routinely in order to provide you with quality care and prescribe medication(s). Most chronic conditions (Example: hypertension, Diabetes, depression/anxiety... etc), require visits a couple times a year. Your provider will instruct you on proper follow up for your personal medical conditions and history. Please make certain to make follow up appointments for your condition as instructed. Failing to do so could result in lapse in your medication treatment/refills. If you request a refill, and are overdue to be seen on a condition, we will always provide you with a 30 day script (once) to allow you time to schedule.    Medicare wellness (well visit): - we have a wonderful Nurse Maudie Mercury), that will meet with you and provide you will yearly medicare wellness visits. These visits should occur yearly (can not be scheduled less than 1 calendar year apart) and cover preventive health, immunizations, advance directives and screenings you are entitled to yearly through your medicare benefits. Do not miss out on your entitled benefits, this is when medicare will pay for these benefits to be ordered for you.  These are strongly encouraged by your provider and is the appropriate type of visit to make certain you are up to date with all preventive health benefits. If you have not had your medicare wellness exam in the last 12 months, please make certain to schedule one by calling the office and schedule your medicare wellness with Maudie Mercury as soon as possible.   Yearly physical (well visit):  - Adults are recommended to be seen yearly for physicals. Check with your insurance  and date of your last physical, most insurances require one calendar year between physicals. Physicals include all preventive health topics, screenings, medical exam and labs that are appropriate for gender/age and history. You may have fasting labs needed at this visit. This is a well visit (not a sick visit), new problems should not be covered during this visit (see acute visit).  - Pediatric patients are seen more frequently when they are younger. Your provider will advise you on well child visit timing that is appropriate for your their age. - This is not a medicare wellness visit. Medicare wellness exams do not have an exam portion to the visit. Some medicare companies allow for a physical, some do not allow a yearly physical. If your medicare allows a yearly physical you can schedule the medicare wellness with our nurse Maudie Mercury and have your physical with your provider after, on the same day. Please check with insurance for your full benefits.   Late Policy/No Shows:  - all new patients should arrive 15-30 minutes earlier than appointment  to allow [place] time  to  obtain all personal demographics,  insurance information and for you to complete office paperwork. - All established patients should arrive 10-15 minutes earlier than appointment time to update all information and be checked in .  - In our best efforts to run on time, if you are late for your appointment you will be asked to either reschedule or if able, we will work you back into the schedule. There will be a wait time to work you back in the schedule,  depending on availability.  - If you are unable to make it to your appointment as scheduled, please call 24 hours ahead of time to allow [place] to fill the time slot with someone else who needs to be seen. If you do not cancel your appointment ahead of time, you may be charged a no show fee.

## 2018-08-30 LAB — LIPID PANEL
Cholesterol: 189 mg/dL (ref <200)
HDL: 67 mg/dL (ref >50)
LDL CHOLESTEROL (CALC): 99 mg/dL
Non-HDL Cholesterol (Calc): 122 mg/dL (calc) (ref <130)
TRIGLYCERIDES: 133 mg/dL (ref <150)
Total CHOL/HDL Ratio: 2.8 (calc) (ref <5.0)

## 2018-08-30 LAB — CBC WITH DIFFERENTIAL/PLATELET
BASOS ABS: 47 {cells}/uL (ref 0–200)
Basophils Relative: 0.5 %
EOS ABS: 102 {cells}/uL (ref 15–500)
Eosinophils Relative: 1.1 %
HCT: 37.8 % (ref 35.0–45.0)
Hemoglobin: 13.2 g/dL (ref 11.7–15.5)
Lymphs Abs: 3264 cells/uL (ref 850–3900)
MCH: 33.7 pg — ABNORMAL HIGH (ref 27.0–33.0)
MCHC: 34.9 g/dL (ref 32.0–36.0)
MCV: 96.4 fL (ref 80.0–100.0)
MPV: 9.5 fL (ref 7.5–12.5)
Monocytes Relative: 7.8 %
Neutro Abs: 5162 cells/uL (ref 1500–7800)
Neutrophils Relative %: 55.5 %
PLATELETS: 311 10*3/uL (ref 140–400)
RBC: 3.92 10*6/uL (ref 3.80–5.10)
RDW: 12.5 % (ref 11.0–15.0)
TOTAL LYMPHOCYTE: 35.1 %
WBC: 9.3 10*3/uL (ref 3.8–10.8)
WBCMIX: 725 {cells}/uL (ref 200–950)

## 2018-08-30 LAB — BASIC METABOLIC PANEL
BUN: 15 mg/dL (ref 7–25)
CALCIUM: 9.9 mg/dL (ref 8.6–10.4)
CO2: 23 mmol/L (ref 20–32)
Chloride: 103 mmol/L (ref 98–110)
Creat: 0.86 mg/dL (ref 0.50–1.05)
Glucose, Bld: 81 mg/dL (ref 65–99)
Potassium: 4.3 mmol/L (ref 3.5–5.3)
SODIUM: 137 mmol/L (ref 135–146)

## 2018-08-30 LAB — TSH: TSH: 1.59 mIU/L

## 2019-02-20 ENCOUNTER — Other Ambulatory Visit: Payer: Self-pay | Admitting: Family Medicine

## 2019-02-20 DIAGNOSIS — I10 Essential (primary) hypertension: Secondary | ICD-10-CM

## 2019-02-21 NOTE — Telephone Encounter (Signed)
Received refill request for BP meds. Please make sure she has enough to get her to her upcoming appt. Can provide a 30 d script if need be.

## 2019-02-21 NOTE — Telephone Encounter (Signed)
LMTRC to see if she has enough BP meds until appt on 02/27/19.

## 2019-02-22 ENCOUNTER — Other Ambulatory Visit: Payer: Self-pay

## 2019-02-22 DIAGNOSIS — I10 Essential (primary) hypertension: Secondary | ICD-10-CM

## 2019-02-22 MED ORDER — LISINOPRIL 40 MG PO TABS: 40.0000 mg | ORAL_TABLET | Freq: Every day | ORAL | 0 refills | Status: DC

## 2019-02-22 MED ORDER — AMLODIPINE BESYLATE 2.5 MG PO TABS: 2.5000 mg | ORAL_TABLET | Freq: Every day | ORAL | 0 refills | Status: DC

## 2019-02-22 NOTE — Progress Notes (Signed)
Patient called [facility] and stated she did not have enough of her BP meds until appt 02/2019. Per PCP, okay to send 30 day supply. Medication was sent.

## 2019-02-22 NOTE — Telephone Encounter (Signed)
Left message notifying patient 30 day supply of BP meds were sent in until appt on 02/27/2019.

## 2019-02-22 NOTE — Telephone Encounter (Signed)
Pt called. She does not have enough medication to get through until OV on 02/27/2019.

## 2019-02-27 ENCOUNTER — Encounter: Payer: Self-pay | Admitting: Family Medicine

## 2019-02-27 ENCOUNTER — Ambulatory Visit: Payer: BLUE CROSS/BLUE SHIELD | Admitting: Family Medicine

## 2019-02-27 VITALS — BP 138/80 | HR 103 | Temp 98.3°F | Resp 16 | Ht 60.0 in | Wt 157.4 lb

## 2019-02-27 DIAGNOSIS — I10 Essential (primary) hypertension: Secondary | ICD-10-CM

## 2019-02-27 MED ORDER — AMLODIPINE BESYLATE 5 MG PO TABS: 5.0000 mg | ORAL_TABLET | Freq: Every day | ORAL | 1 refills | Status: DC

## 2019-02-27 MED ORDER — LISINOPRIL 40 MG PO TABS: 40.0000 mg | ORAL_TABLET | Freq: Every day | ORAL | 1 refills | Status: DC

## 2019-02-27 NOTE — Progress Notes (Signed)
Carla Phillips , [birth year], 53 y.o., female MRN: [id number] Patient Care Team    Relationship Specialty Notifications Start End  Natalia Leatherwood, DO PCP - General Family Medicine  10/20/15    Chief Complaint  Patient presents with  . Follow-up    [facility], Hypertension    Subjective:  Hypertension/obesity: Pt reports compliance with Lisinopril 40 mg daily and amlodipine 2.5 mg QD. Blood pressures ranges at home not checked. Patient denies chest pain, shortness of breath, dizziness or lower extremity edema.   Pt does not take a daily baby ASA. Pt is not prescribed statin. Patient had been on HCTZ 50 mg daily in the past. She never had good control of her blood pressure. She had frequent urination which bothered her.  BMP: 09/13/2017 GFR normal CBC: 09/13/2017, within normal limits TSH: 09/13/2017 WNL Lipids: 10/28/2015 total cholesterol 166, HDL 78, LDL 71, triglycerides 81 Diet: Does not watch the sodium in her diet very closely. Exercise: Does not exercise routinely RF: Hypertension, obesity, smoker  No Known Allergies Social History   Tobacco Use  . Smoking status: Current Every Day Smoker    Packs/day: 0.50    Types: Cigarettes  . Smokeless tobacco: Never Used  Substance Use Topics  . Alcohol use: Yes    Comment: daily; 3 beers a day   Past Medical History:  Diagnosis Date  . Anemia   . Frequent headaches   . History of blood transfusion   . Hypertension    Past Surgical History:  Procedure Laterality Date  . TUBAL LIGATION     Family History  Problem Relation Age of Onset  . Arthritis Mother   . COPD Father   . Cancer Neg Hx   . Heart disease Neg Hx    Allergies as of 02/27/2019   No Known Allergies     Medication List       Accurate as of February 27, 2019  2:59 PM. Always use your most recent med list.        amLODipine 5 MG tablet Commonly known as:  NORVASC Take 1 tablet (5 mg total) by mouth daily.   lisinopril 40 MG tablet Commonly known as:   PRINIVIL,ZESTRIL Take 1 tablet (40 mg total) by mouth daily.   ZYRTEC PO Take by mouth.       No results found for this or any previous visit (from the past 24 hour(s)). No results found.   ROS: Negative, with the exception of above mentioned in HPI  Objective:  BP 138/80 (BP Location: Left Arm, Patient Position: Sitting, Cuff Size: Normal)   Pulse (!) 103   Temp 98.3 F (36.8 C) (Oral)   Resp 16   Ht 5' (1.524 m)   Wt 157 lb 6.4 oz (71.4 kg)   SpO2 98%   BMI 30.74 kg/m  Body mass index is 30.74 kg/m.  Gen: Afebrile. No acute distress. Nontoxic. Obese female.  HENT: AT. [place].  MMM.  Eyes:Pupils Equal Round Reactive to light, Extraocular movements intact,  Conjunctiva without redness, discharge or icterus. Neck/lymp/endocrine: Supple,no lymphadenopathy, no thyromegaly CV: RRR no murmur, no edema, +2/4 P posterior tibialis pulses Chest: CTAB, no wheeze or crackles Abd: Soft. NTND. BS present. no Masses palpated.  Neuro:  Normal gait. PERLA. EOMi. Alert. Oriented x3  Psych: Normal affect, dress and demeanor. Normal speech. Normal thought content and judgment.  Assessment/Plan: Carla Phillips is a 53 y.o. female present for follow up OV for  Essential hypertension/obesity/tobacco abuse -  Borderline but stable.  -  Continue  lisinopril 40 mg QD.  - increase amlodipine to  5mg  a day. - Low-sodium diet, increase exercise greater than 150 minutes a week. - F/U 6 months with provider.   No orders of the defined types were placed in this encounter.    electronically signed by:  Felix Pacini, DO  [facility] - [place]

## 2019-02-27 NOTE — Patient Instructions (Signed)
Great to see you today.  I have refilled your meds- and the amlodipine will be 5 mg now.   Follow up on 6 months for a physical and we will perform your fasting labs that day.  Please help [place] help you:  We are honored you have chosen [facility] for your Primary Care home. Below you will find basic instructions that you may need to access in the future. Please help [place] help you by reading the instructions, which cover many of the frequent questions we experience.   Prescription refills and request:  -In order to allow more efficient response time, please call your pharmacy for all refills. They will forward the request electronically to [place]. This allows for the quickest possible response. Request left on a nurse line can take longer to refill, since these are checked as time allows between office patients and other phone calls.  - refill request can take up to 3-5 working days to complete.  - If request is sent electronically and request is appropiate, it is usually completed in 1-2 business days.  - all patients will need to be seen routinely for all chronic medical conditions requiring prescription medications (see follow-up below). If you are overdue for follow up on your condition, you will be asked to make an appointment and we will call in enough medication to cover you until your appointment (up to 30 days).  - all controlled substances will require a face to face visit to request/refill.  - if you desire your prescriptions to go through a new pharmacy, and have an active script at original pharmacy, you will need to call your pharmacy and have scripts transferred to new pharmacy. This is completed between the pharmacy locations and not by your provider.    Results: If any images or labs were ordered, it can take up to 1 week to get results depending on the test ordered and the lab/facility running and resulting the test. - Normal or stable results, which do not need further discussion,  may be released to your mychart immediately with attached note to you. A call may not be generated for normal results. Please make certain to sign up for mychart. If you have questions on how to activate your mychart you can call the front office.  - If your results need further discussion, our office will attempt to contact you via phone, and if unable to reach you after 2 attempts, we will release your abnormal result to your mychart with instructions.  - All results will be automatically released in mychart after 1 week.  - Your provider will provide you with explanation and instruction on all relevant material in your results. Please keep in mind, results and labs may appear confusing or abnormal to the untrained eye, but it does not mean they are actually abnormal for you personally. If you have any questions about your results that are not covered, or you desire more detailed explanation than what was provided, you should make an appointment with your provider to do so.   Our office handles many outgoing and incoming calls daily. If we have not contacted you within 1 week about your results, please check your mychart to see if there is a message first and if not, then contact our office.  In helping with this matter, you help decrease call volume, and therefore allow [place] to be able to respond to patients needs more efficiently.   Acute office visits (sick visit):  An acute  visit is intended for a new problem and are scheduled in shorter time slots to allow schedule openings for patients with new problems. This is the appropriate visit to discuss a new problem. Problems will not be addressed by phone call or Echart message. Appointment is needed if requesting treatment. In order to provide you with excellent quality medical care with proper time for you to explain your problem, have an exam and receive treatment with instructions, these appointments should be limited to one new problem per visit. If you  experience a new problem, in which you desire to be addressed, please make an acute office visit, we save openings on the schedule to accommodate you. Please do not save your new problem for any other type of visit, let us take care of it properly and quickly for you.   Follow up visits:  Depending on your condition(s) your provider will need to see you routinely in order to provide you with quality care and prescribe medication(s). Most chronic conditions (Example: hypertension, Diabetes, depression/anxiety... etc), require visits a couple times a year. Your provider will instruct you on proper follow up for your personal medical conditions and history. Please make certain to make follow up appointments for your condition as instructed. Failing to do so could result in lapse in your medication treatment/refills. If you request a refill, and are overdue to be seen on a condition, we will always provide you with a 30 day script (once) to allow you time to schedule.    Medicare wellness (well visit): - we have a wonderful Nurse Maudie Mercury), that will meet with you and provide you will yearly medicare wellness visits. These visits should occur yearly (can not be scheduled less than 1 calendar year apart) and cover preventive health, immunizations, advance directives and screenings you are entitled to yearly through your medicare benefits. Do not miss out on your entitled benefits, this is when medicare will pay for these benefits to be ordered for you.  These are strongly encouraged by your provider and is the appropriate type of visit to make certain you are up to date with all preventive health benefits. If you have not had your medicare wellness exam in the last 12 months, please make certain to schedule one by calling the office and schedule your medicare wellness with Maudie Mercury as soon as possible.   Yearly physical (well visit):  - Adults are recommended to be seen yearly for physicals. Check with your insurance  and date of your last physical, most insurances require one calendar year between physicals. Physicals include all preventive health topics, screenings, medical exam and labs that are appropriate for gender/age and history. You may have fasting labs needed at this visit. This is a well visit (not a sick visit), new problems should not be covered during this visit (see acute visit).  - Pediatric patients are seen more frequently when they are younger. Your provider will advise you on well child visit timing that is appropriate for your their age. - This is not a medicare wellness visit. Medicare wellness exams do not have an exam portion to the visit. Some medicare companies allow for a physical, some do not allow a yearly physical. If your medicare allows a yearly physical you can schedule the medicare wellness with our nurse Maudie Mercury and have your physical with your provider after, on the same day. Please check with insurance for your full benefits.   Late Policy/No Shows:  - all new patients should arrive 15-30 minutes  earlier than appointment to allow [place] time  to  obtain all personal demographics,  insurance information and for you to complete office paperwork. - All established patients should arrive 10-15 minutes earlier than appointment time to update all information and be checked in .  - In our best efforts to run on time, if you are late for your appointment you will be asked to either reschedule or if able, we will work you back into the schedule. There will be a wait time to work you back in the schedule,  depending on availability.  - If you are unable to make it to your appointment as scheduled, please call 24 hours ahead of time to allow [place] to fill the time slot with someone else who needs to be seen. If you do not cancel your appointment ahead of time, you may be charged a no show fee.

## 2019-08-20 ENCOUNTER — Ambulatory Visit (INDEPENDENT_AMBULATORY_CARE_PROVIDER_SITE_OTHER): Payer: BC Managed Care – PPO | Admitting: Family Medicine

## 2019-08-20 ENCOUNTER — Encounter: Payer: Self-pay | Admitting: Family Medicine

## 2019-08-20 ENCOUNTER — Other Ambulatory Visit: Payer: Self-pay

## 2019-08-20 VITALS — BP 127/79 | HR 99 | Temp 97.8°F | Resp 16 | Ht 60.24 in | Wt 155.5 lb

## 2019-08-20 DIAGNOSIS — Z1211 Encounter for screening for malignant neoplasm of colon: Secondary | ICD-10-CM

## 2019-08-20 DIAGNOSIS — Z23 Encounter for immunization: Secondary | ICD-10-CM

## 2019-08-20 DIAGNOSIS — E669 Obesity, unspecified: Secondary | ICD-10-CM

## 2019-08-20 DIAGNOSIS — Z0000 Encounter for general adult medical examination without abnormal findings: Secondary | ICD-10-CM | POA: Diagnosis not present

## 2019-08-20 DIAGNOSIS — Z131 Encounter for screening for diabetes mellitus: Secondary | ICD-10-CM

## 2019-08-20 DIAGNOSIS — Z1239 Encounter for other screening for malignant neoplasm of breast: Secondary | ICD-10-CM

## 2019-08-20 DIAGNOSIS — Z720 Tobacco use: Secondary | ICD-10-CM

## 2019-08-20 DIAGNOSIS — I10 Essential (primary) hypertension: Secondary | ICD-10-CM

## 2019-08-20 LAB — TSH: TSH: 1.06 u[IU]/mL (ref 0.35–4.50)

## 2019-08-20 LAB — HEMOGLOBIN A1C: Hgb A1c MFr Bld: 5.4 % (ref 4.6–6.5)

## 2019-08-20 LAB — LIPID PANEL
Cholesterol: 197 mg/dL (ref 0–200)
HDL: 71.2 mg/dL (ref >39.00)
LDL Cholesterol: 112 mg/dL — ABNORMAL HIGH (ref 0–99)
NonHDL: 125.77
Total CHOL/HDL Ratio: 3
Triglycerides: 71 mg/dL (ref 0.0–149.0)
VLDL: 14.2 mg/dL (ref 0.0–40.0)

## 2019-08-20 LAB — COMPREHENSIVE METABOLIC PANEL
ALT: 18 U/L (ref 0–35)
AST: 19 U/L (ref 0–37)
Albumin: 4.8 g/dL (ref 3.5–5.2)
Alkaline Phosphatase: 88 U/L (ref 39–117)
BUN: 9 mg/dL (ref 6–23)
CO2: 23 mEq/L (ref 19–32)
Calcium: 9.6 mg/dL (ref 8.4–10.5)
Chloride: 100 mEq/L (ref 96–112)
Creatinine, Ser: 0.71 mg/dL (ref 0.40–1.20)
GFR: 86.24 mL/min (ref >60.00)
Glucose, Bld: 68 mg/dL — ABNORMAL LOW (ref 70–99)
Potassium: 4.5 mEq/L (ref 3.5–5.1)
Sodium: 134 mEq/L — ABNORMAL LOW (ref 135–145)
Total Bilirubin: 0.5 mg/dL (ref 0.2–1.2)
Total Protein: 6.8 g/dL (ref 6.0–8.3)

## 2019-08-20 LAB — CBC WITH DIFFERENTIAL/PLATELET
Basophils Absolute: 0 10*3/uL (ref 0.0–0.1)
Basophils Relative: 0.5 % (ref 0.0–3.0)
Eosinophils Absolute: 0.1 10*3/uL (ref 0.0–0.7)
Eosinophils Relative: 1.2 % (ref 0.0–5.0)
HCT: 44.4 % (ref 36.0–46.0)
Hemoglobin: 14.9 g/dL (ref 12.0–15.0)
Lymphocytes Relative: 23.5 % (ref 12.0–46.0)
Lymphs Abs: 1.9 10*3/uL (ref 0.7–4.0)
MCHC: 33.6 g/dL (ref 30.0–36.0)
MCV: 100 fl (ref 78.0–100.0)
Monocytes Absolute: 0.7 10*3/uL (ref 0.1–1.0)
Monocytes Relative: 9.2 % (ref 3.0–12.0)
Neutro Abs: 5.3 10*3/uL (ref 1.4–7.7)
Neutrophils Relative %: 65.6 % (ref 43.0–77.0)
Platelets: 278 10*3/uL (ref 150.0–400.0)
RBC: 4.44 Mil/uL (ref 3.87–5.11)
RDW: 13.6 % (ref 11.5–15.5)
WBC: 8.1 10*3/uL (ref 4.0–10.5)

## 2019-08-20 MED ORDER — AMLODIPINE BESYLATE 5 MG PO TABS: 5.0000 mg | ORAL_TABLET | Freq: Every day | ORAL | 1 refills | Status: DC

## 2019-08-20 MED ORDER — LISINOPRIL 40 MG PO TABS: 40.0000 mg | ORAL_TABLET | Freq: Every day | ORAL | 1 refills | Status: DC

## 2019-08-20 NOTE — Patient Instructions (Signed)
 Health Maintenance, Female Adopting a healthy lifestyle and getting preventive care are important in promoting health and wellness. Ask your health care provider about:  The right schedule for you to have regular tests and exams.  Things you can do on your own to prevent diseases and keep yourself healthy. What should I know about diet, weight, and exercise? Eat a healthy diet   Eat a diet that includes plenty of vegetables, fruits, low-fat dairy products, and lean protein.  Do not eat a lot of foods that are high in solid fats, added sugars, or sodium. Maintain a healthy weight Body mass index (BMI) is used to identify weight problems. It estimates body fat based on height and weight. Your health care provider can help determine your BMI and help you achieve or maintain a healthy weight. Get regular exercise Get regular exercise. This is one of the most important things you can do for your health. Most adults should:  Exercise for at least 150 minutes each week. The exercise should increase your heart rate and make you sweat (moderate-intensity exercise).  Do strengthening exercises at least twice a week. This is in addition to the moderate-intensity exercise.  Spend less time sitting. Even light physical activity can be beneficial. Watch cholesterol and blood lipids Have your blood tested for lipids and cholesterol at 52 years of age, then have this test every 5 years. Have your cholesterol levels checked more often if:  Your lipid or cholesterol levels are high.  You are older than 52 years of age.  You are at high risk for heart disease. What should I know about cancer screening? Depending on your health history and family history, you may need to have cancer screening at various ages. This may include screening for:  Breast cancer.  Cervical cancer.  Colorectal cancer.  Skin cancer.  Lung cancer. What should I know about heart disease, diabetes, and high blood  pressure? Blood pressure and heart disease  High blood pressure causes heart disease and increases the risk of stroke. This is more likely to develop in people who have high blood pressure readings, are of African descent, or are overweight.  Have your blood pressure checked: ? Every 3-5 years if you are 18-39 years of age. ? Every year if you are 40 years old or older. Diabetes Have regular diabetes screenings. This checks your fasting blood sugar level. Have the screening done:  Once every three years after age 40 if you are at a normal weight and have a low risk for diabetes.  More often and at a younger age if you are overweight or have a high risk for diabetes. What should I know about preventing infection? Hepatitis B If you have a higher risk for hepatitis B, you should be screened for this virus. Talk with your health care provider to find out if you are at risk for hepatitis B infection. Hepatitis C Testing is recommended for:  Everyone born from 1945 through 1965.  Anyone with known risk factors for hepatitis C. Sexually transmitted infections (STIs)  Get screened for STIs, including gonorrhea and chlamydia, if: ? You are sexually active and are younger than 52 years of age. ? You are older than 52 years of age and your health care provider tells you that you are at risk for this type of infection. ? Your sexual activity has changed since you were last screened, and you are at increased risk for chlamydia or gonorrhea. Ask your health care provider   if you are at risk.  Ask your health care provider about whether you are at high risk for HIV. Your health care provider may recommend a prescription medicine to help prevent HIV infection. If you choose to take medicine to prevent HIV, you should first get tested for HIV. You should then be tested every 3 months for as long as you are taking the medicine. Pregnancy  If you are about to stop having your period (premenopausal) and  you may become pregnant, seek counseling before you get pregnant.  Take 400 to 800 micrograms (mcg) of folic acid every day if you become pregnant.  Ask for birth control (contraception) if you want to prevent pregnancy. Osteoporosis and menopause Osteoporosis is a disease in which the bones lose minerals and strength with aging. This can result in bone fractures. If you are 65 years old or older, or if you are at risk for osteoporosis and fractures, ask your health care provider if you should:  Be screened for bone loss.  Take a calcium or vitamin D supplement to lower your risk of fractures.  Be given hormone replacement therapy (HRT) to treat symptoms of menopause. Follow these instructions at home: Lifestyle  Do not use any products that contain nicotine or tobacco, such as cigarettes, e-cigarettes, and chewing tobacco. If you need help quitting, ask your health care provider.  Do not use street drugs.  Do not share needles.  Ask your health care provider for help if you need support or information about quitting drugs. Alcohol use  Do not drink alcohol if: ? Your health care provider tells you not to drink. ? You are pregnant, may be pregnant, or are planning to become pregnant.  If you drink alcohol: ? Limit how much you use to 0-1 drink a day. ? Limit intake if you are breastfeeding.  Be aware of how much alcohol is in your drink. In the [place] one drink equals one 12 oz bottle of beer (355 mL), one 5 oz glass of wine (148 mL), or one 1 oz glass of hard liquor (44 mL). General instructions  Schedule regular health, dental, and eye exams.  Stay current with your vaccines.  Tell your health care provider if: ? You often feel depressed. ? You have ever been abused or do not feel safe at home. Summary  Adopting a healthy lifestyle and getting preventive care are important in promoting health and wellness.  Follow your health care provider's instructions about healthy  diet, exercising, and getting tested or screened for diseases.  Follow your health care provider's instructions on monitoring your cholesterol and blood pressure. This information is not intended to replace advice given to you by your health care provider. Make sure you discuss any questions you have with your health care provider. Document Released: 06/27/2011 Document Revised: 12/05/2018 Document Reviewed: 12/05/2018 Elsevier Patient Education  2020 Elsevier Inc.   Steps to Quit Smoking Smoking tobacco is the leading cause of preventable death. It can affect almost every organ in the body. Smoking puts you and people around you at risk for many serious, long-lasting (chronic) diseases. Quitting smoking can be hard, but it is one of the best things that you can do for your health. It is never too late to quit. How do I get ready to quit? When you decide to quit smoking, make a plan to help you succeed. Before you quit:  Pick a date to quit. Set a date within the next 2 weeks to give   you time to prepare.  Write down the reasons why you are quitting. Keep this list in places where you will see it often.  Tell your family, friends, and co-workers that you are quitting. Their support is important.  Talk with your doctor about the choices that may help you quit.  Find out if your health insurance will pay for these treatments.  Know the people, places, things, and activities that make you want to smoke (triggers). Avoid them. What first steps can I take to quit smoking?  Throw away all cigarettes at home, at work, and in your car.  Throw away the things that you use when you smoke, such as ashtrays and lighters.  Clean your car. Make sure to empty the ashtray.  Clean your home, including curtains and carpets. What can I do to help me quit smoking? Talk with your doctor about taking medicines and seeing a counselor at the same time. You are more likely to succeed when you do both.  If  you are pregnant or breastfeeding, talk with your doctor about counseling or other ways to quit smoking. Do not take medicine to help you quit smoking unless your doctor tells you to do so. To quit smoking: Quit right away  Quit smoking totally, instead of slowly cutting back on how much you smoke over a period of time.  Go to counseling. You are more likely to quit if you go to counseling sessions regularly. Take medicine You may take medicines to help you quit. Some medicines need a prescription, and some you can buy over-the-counter. Some medicines may contain a drug called nicotine to replace the nicotine in cigarettes. Medicines may:  Help you to stop having the desire to smoke (cravings).  Help to stop the problems that come when you stop smoking (withdrawal symptoms). Your doctor may ask you to use:  Nicotine patches, gum, or lozenges.  Nicotine inhalers or sprays.  Non-nicotine medicine that is taken by mouth. Find resources Find resources and other ways to help you quit smoking and remain smoke-free after you quit. These resources are most helpful when you use them often. They include:  Online chats with a counselor.  Phone quitlines.  Printed self-help materials.  Support groups or group counseling.  Text messaging programs.  Mobile phone apps. Use apps on your mobile phone or tablet that can help you stick to your quit plan. There are many free apps for mobile phones and tablets as well as websites. Examples include Quit Guide from the CDC and smokefree.gov  What things can I do to make it easier to quit?   Talk to your family and friends. Ask them to support and encourage you.  Call a phone quitline (1-800-QUIT-NOW), reach out to support groups, or work with a counselor.  Ask people who smoke to not smoke around you.  Avoid places that make you want to smoke, such as: ? Bars. ? Parties. ? Smoke-break areas at work.  Spend time with people who do not smoke.   Lower the stress in your life. Stress can make you want to smoke. Try these things to help your stress: ? Getting regular exercise. ? Doing deep-breathing exercises. ? Doing yoga. ? Meditating. ? Doing a body scan. To do this, close your eyes, focus on one area of your body at a time from head to toe. Notice which parts of your body are tense. Try to relax the muscles in those areas. How will I feel when I quit   smoking? Day 1 to 3 weeks Within the first 24 hours, you may start to have some problems that come from quitting tobacco. These problems are very bad 2-3 days after you quit, but they do not often last for more than 2-3 weeks. You may get these symptoms:  Mood swings.  Feeling restless, nervous, angry, or annoyed.  Trouble concentrating.  Dizziness.  Strong desire for high-sugar foods and nicotine.  Weight gain.  Trouble pooping (constipation).  Feeling like you may vomit (nausea).  Coughing or a sore throat.  Changes in how the medicines that you take for other issues work in your body.  Depression.  Trouble sleeping (insomnia). Week 3 and afterward After the first 2-3 weeks of quitting, you may start to notice more positive results, such as:  Better sense of smell and taste.  Less coughing and sore throat.  Slower heart rate.  Lower blood pressure.  Clearer skin.  Better breathing.  Fewer sick days. Quitting smoking can be hard. Do not give up if you fail the first time. Some people need to try a few times before they succeed. Do your best to stick to your quit plan, and talk with your doctor if you have any questions or concerns. Summary  Smoking tobacco is the leading cause of preventable death. Quitting smoking can be hard, but it is one of the best things that you can do for your health.  When you decide to quit smoking, make a plan to help you succeed.  Quit smoking right away, not slowly over a period of time.  When you start quitting, seek  help from your doctor, family, or friends. This information is not intended to replace advice given to you by your health care provider. Make sure you discuss any questions you have with your health care provider. Document Released: 10/08/2009 Document Revised: 03/01/2019 Document Reviewed: 03/02/2019 Elsevier Patient Education  2020 Elsevier Inc.  

## 2019-08-20 NOTE — Progress Notes (Signed)
Patient ID: Carla Phillips, female  DOB: 07/29/1966, 53 y.o.   MRN: [id number] Patient Care Team    Relationship Specialty Notifications Start End  Ma Hillock, DO PCP - General Family Medicine  10/20/15     Chief Complaint  Patient presents with  . Annual Exam    not fasting. mamm 2017. pap 2017. colonoscopy never    Subjective:  Carla Phillips is a 53 y.o.  Female  present for CPE. All past medical history, surgical history, allergies, family history, immunizations, medications and social history were updated in the electronic medical record today. All recent labs, ED visits and hospitalizations within the last year were reviewed.  Hypertension/obesity: Pt reports compliance with Lisinopril 40 mg daily and amlodipine 5 mg QD. Blood pressures ranges at home not checked.Patient denies chest pain, shortness of breath, dizziness or lower extremity edema.  Pt does not take a daily baby ASA. Pt is not prescribed statin. Patient had been on HCTZ 50 mg daily in the past. She never had good control of her blood pressure. She had frequent urination which bothered her.  BMP: 09/13/2017 GFR normal CBC: 09/13/2017, within normal limits TSH: 09/13/2017 WNL Lipids: 10/28/2015 total cholesterol 166, HDL 78, LDL 71, triglycerides 81 Diet: Does not watch the sodium in her diet very closely. Exercise: Does not exercise routinely RF: Hypertension, obesity, smoker  Health maintenance:  Colonoscopy: no fhx. Desires cologuard testing >>ordered. Mammogram: completed:2017 BCGSO>> ordered.  Cervical cancer screening: last pap: 2017, results: NL/NEG HPV, completed by: Raoul Pitch- rpt 3-5 yrs.  Immunizations: tdap UTD 2016, Influenza declined(encouraged yearly), PNA23 provided today.  shingrix declined today Infectious disease screening: HIV declined today DEXA: consider early screen at 40, smoker Assistive device: none Oxygen HGD:JMEQ Patient has a Dental home. Hospitalizations/ED visits:  reviewed   Depression screen [facility] 2/9 08/20/2019 02/27/2019 08/29/2018 01/08/2018 09/05/2017  Decreased Interest 0 0 0 0 0  Down, Depressed, Hopeless 0 0 0 0 0  PHQ - 2 Score 0 0 0 0 0   No flowsheet data found.   Immunization History  Administered Date(s) Administered  . Pneumococcal Polysaccharide-23 08/20/2019  . Tdap 10/20/2015     Past Medical History:  Diagnosis Date  . Anemia   . Frequent headaches   . History of blood transfusion   . Hypertension    No Known Allergies Past Surgical History:  Procedure Laterality Date  . TUBAL LIGATION     Family History  Problem Relation Age of Onset  . Arthritis Mother   . COPD Father   . Cancer Neg Hx   . 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 Also works for [employer].   Current every day smoker. Drinks 3 beers a day. Denies recreational drugs or caffeine use. Denies herbal remedies.   Wear seatbelt. Does not exercise routinely.   Smoke alarm present home.       Allergies as of 08/20/2019   No Known Allergies     Medication List       Accurate as of August 20, 2019 11:21 AM. If you have any questions, ask your nurse or doctor.        amLODipine 5 MG tablet Commonly known as: NORVASC Take 1 tablet (5 mg total) by mouth daily.   lisinopril 40 MG tablet Commonly known as: ZESTRIL Take 1 tablet (  40 mg total) by mouth daily.   ZYRTEC PO Take by mouth.       All past medical history, surgical history, allergies, family history, immunizations andmedications were updated in the EMR today and reviewed under the history and medication portions of their EMR.     No results found for this or any previous visit (from the past 2160 hour(s)).   ROS: 14 pt review of systems performed and negative (unless mentioned in an HPI)  Objective: BP 127/79 (BP Location: Left Arm, Patient Position:  Sitting, Cuff Size: Large)   Pulse 99   Temp 97.8 F (36.6 C) (Temporal)   Resp 16   Ht 5' 0.24" (1.53 m)   Wt 155 lb 8 oz (70.5 kg)   SpO2 97%   BMI 30.13 kg/m  Gen: Afebrile. No acute distress. Nontoxic in appearance, well-developed, well-nourished,  Pleasant caucasian female, obese.  HENT: AT. [place]. Bilateral TM visualized and normal in appearance, normal external auditory canal. MMM, no oral lesions, adequate dentition. Bilateral nares within normal limits. Throat without erythema, ulcerations or exudates. no Cough on exam, no hoarseness on exam. Eyes:Pupils Equal Round Reactive to light, Extraocular movements intact,  Conjunctiva without redness, discharge or icterus. Neck/lymp/endocrine: Supple,no lymphadenopathy, no thyromegaly CV: RRR no murmur, no edema, +2/4 P posterior tibialis pulses. no carotid bruits. No JVD. Chest: CTAB, no wheeze, rhonchi or crackles. normal Respiratory effort. good Air movement. Abd: Soft. obese. NTND. BS present. no Masses palpated. No hepatosplenomegaly. No rebound tenderness or guarding. Skin: no rashes, purpura or petechiae. Warm and well-perfused. Skin intact. Neuro/Msk:  Normal gait. PERLA. EOMi. Alert. Oriented x3.  Cranial nerves II through XII intact. Muscle strength 5/5 upper/lower extremity. DTRs equal bilaterally. Psych: Normal affect, dress and demeanor. Normal speech. Normal thought content and judgment.   No exam data present  Assessment/plan: Carla Phillips is a 53 y.o. female present for CPE Essential hypertension/obesity -  Stable -  Continue  lisinopril 40 mg QD.  - increase amlodipine to  5mg  a day. - Low-sodium diet, increase exercise greater than 150 minutes a week. - F/U 6 months with provider.  - CBC with Differential/Platelet - Comprehensive metabolic panel - TSH - Lipid panel - amLODipine (NORVASC) 5 MG tablet; Take 1 tablet (5 mg total) by mouth daily.  Dispense: 90 tablet; Refill: 1 - lisinopril (ZESTRIL) 40 MG  tablet; Take 1 tablet (40 mg total) by mouth daily.  Dispense: 90 tablet; Refill: 1 Obesity (BMI 30-39.9) Routine exercise and diet recs - TSH - Lipid panel Diabetes mellitus screening - Hemoglobin A1c Tobacco abuse Declined smoking cessation - pneumovax provided.  Colon cancer screening - Cologuard Breast cancer screening - MM 3D SCREEN BREAST BILATERAL; Future  Encounter for preventive health examination Patient was encouraged to exercise greater than 150 minutes a week. Patient was encouraged to choose a diet filled with fresh fruits and vegetables, and lean meats. AVS provided to patient today for education/recommendation on gender specific health and safety maintenance. Colonoscopy: no fhx. Desires cologuard testing >>ordered. Mammogram: completed:2017 BCGSO>> ordered.  Cervical cancer screening: last pap: 2017, results: NL/NEG HPV, completed by: Claiborne BillingsKuneff- rpt 3-5 yrs.  Immunizations: tdap UTD 2016, Influenza declined(encouraged yearly), PNA23 provided today.  shingrix declined today  Return in about 1 year (around 08/19/2020) for CPE (30 min). 6 months [facility] follow up Electronically signed by: Felix Pacinienee Dalayza Zambrana, DO [facility]

## 2019-08-23 ENCOUNTER — Ambulatory Visit
Admission: RE | Admit: 2019-08-23 | Discharge: 2019-08-23 | Disposition: A | Discharge status: Home / Self Care | Payer: BC Managed Care – PPO | Source: Ambulatory Visit | Attending: Family Medicine | Admitting: Family Medicine

## 2019-08-23 ENCOUNTER — Other Ambulatory Visit: Payer: Self-pay

## 2019-08-23 DIAGNOSIS — Z1239 Encounter for other screening for malignant neoplasm of breast: Secondary | ICD-10-CM

## 2020-03-09 ENCOUNTER — Telehealth: Payer: Self-pay

## 2020-03-09 DIAGNOSIS — I10 Essential (primary) hypertension: Secondary | ICD-10-CM

## 2020-03-09 MED ORDER — LISINOPRIL 40 MG PO TABS: 40.0000 mg | ORAL_TABLET | Freq: Every day | ORAL | 0 refills | Status: DC

## 2020-03-09 MED ORDER — AMLODIPINE BESYLATE 5 MG PO TABS: 5.0000 mg | ORAL_TABLET | Freq: Every day | ORAL | 0 refills | Status: DC

## 2020-03-09 NOTE — Telephone Encounter (Signed)
Patient request refill on meds.  Patient scheduled for 6 month follow up on 3/24 with Dr. Claiborne Billings.  amLODipine (NORVASC) 5 MG tablet [[id number]]  lisinopril (ZESTRIL) 40 MG tablet [[id number]]    [facility]

## 2020-03-09 NOTE — Telephone Encounter (Signed)
Pt notified, she has already picked up meds

## 2020-03-09 NOTE — Telephone Encounter (Signed)
30 day supply sent to pharmacy for patient to last until she is able to come for appt

## 2020-03-17 ENCOUNTER — Other Ambulatory Visit: Payer: Self-pay

## 2020-03-17 DIAGNOSIS — Z1211 Encounter for screening for malignant neoplasm of colon: Secondary | ICD-10-CM

## 2020-03-17 LAB — COLOGUARD
COLOGUARD: NEGATIVE
Cologuard: NEGATIVE

## 2020-03-17 NOTE — Addendum Note (Signed)
Addended by: Alysia Penna on: 03/17/2020 01:45 PM   Modules accepted: Orders

## 2020-03-18 ENCOUNTER — Ambulatory Visit: Payer: 59 | Admitting: Family Medicine

## 2020-03-18 ENCOUNTER — Encounter: Payer: Self-pay | Admitting: Family Medicine

## 2020-03-18 ENCOUNTER — Other Ambulatory Visit: Payer: Self-pay

## 2020-03-18 VITALS — BP 139/82 | HR 90 | Temp 97.9°F | Resp 16 | Ht 60.0 in | Wt 139.6 lb

## 2020-03-18 DIAGNOSIS — I10 Essential (primary) hypertension: Secondary | ICD-10-CM

## 2020-03-18 DIAGNOSIS — E669 Obesity, unspecified: Secondary | ICD-10-CM | POA: Diagnosis not present

## 2020-03-18 MED ORDER — LISINOPRIL 40 MG PO TABS: 40.0000 mg | ORAL_TABLET | Freq: Every day | ORAL | 1 refills | Status: DC

## 2020-03-18 MED ORDER — AMLODIPINE BESYLATE 5 MG PO TABS: 5.0000 mg | ORAL_TABLET | Freq: Every day | ORAL | 1 refills | Status: DC

## 2020-03-18 NOTE — Progress Notes (Signed)
Patient ID: Carla Phillips, female  DOB: May 25, 1966, 54 y.o.   MRN: [id number] Patient Care Team    Relationship Specialty Notifications Start End  Ma Hillock, DO PCP - General Family Medicine  10/20/15     Chief Complaint  Patient presents with  . Hypertension    Subjective: Carla Phillips is a 54 y.o.  Female  present for [facility] Hypertension/obesity: Pt reports compliance with Lisinopril 40 mg daily and amlodipine 5 mg QD. Blood pressures ranges at home not checked.Patient denies chest pain, shortness of breath, dizziness or lower extremity edema.  Pt does not take a daily baby ASA. Pt is not prescribed statin. Patient had been on HCTZ 50 mg daily in the past. She never had good control of her blood pressure. She had frequent urination which bothered her.  Labs UTD 08/20/2019 Diet: Does not watch the sodium in her diet very closely. Exercise: Does not exercise routinely RF: Hypertension, obesity, smoker   Depression screen [facility] 2/9 08/20/2019 02/27/2019 08/29/2018 01/08/2018 09/05/2017  Decreased Interest 0 0 0 0 0  Down, Depressed, Hopeless 0 0 0 0 0  PHQ - 2 Score 0 0 0 0 0   No flowsheet data found.   Immunization History  Administered Date(s) Administered  . Pneumococcal Polysaccharide-23 08/20/2019  . Tdap 10/20/2015     Past Medical History:  Diagnosis Date  . Anemia   . Frequent headaches   . History of blood transfusion   . Hypertension    No Known Allergies Past Surgical History:  Procedure Laterality Date  . TUBAL LIGATION     Family History  Problem Relation Age of Onset  . Arthritis Mother   . COPD Father   . Cancer Neg Hx   . 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   Current every day smoker. Drinks 3 beers a day. Denies recreational drugs or caffeine use. Denies herbal  remedies.   Wear seatbelt. Does not exercise routinely.   Smoke alarm present home.       Allergies as of 03/18/2020   No Known Allergies     Medication List       Accurate as of March 18, 2020  3:15 PM. If you have any questions, ask your nurse or doctor.        amLODipine 5 MG tablet Commonly known as: NORVASC Take 1 tablet (5 mg total) by mouth daily.   lisinopril 40 MG tablet Commonly known as: ZESTRIL Take 1 tablet (40 mg total) by mouth daily.   ZYRTEC PO Take by mouth.       All past medical history, surgical history, allergies, family history, immunizations andmedications were updated in the EMR today and reviewed under the history and medication portions of their EMR.     Recent Results (from the past 2160 hour(s))  Cologuard     Status: None   Collection Time: 03/11/20 12:00 PM  Result Value Ref Range   Cologuard Negative Negative     ROS: 14 pt review of systems performed and negative (unless mentioned in an HPI)  Objective: BP 139/82 (BP Location: Right Arm, Patient Position: Sitting, Cuff Size: Normal)   Pulse 90   Temp 97.9 F (36.6 C) (Temporal)   Resp 16   Ht 5' (1.524 m)   Wt  139 lb 9.6 oz (63.3 kg)   SpO2 97%   BMI 27.26 kg/m  Gen: Afebrile. No acute distress.  HENT: AT. [place]. Eyes:Pupils Equal Round Reactive to light, Extraocular movements intact,  Conjunctiva without redness, discharge or icterus. Neck/lymp/endocrine: Supple,no lymphadenopathy, no thyromegaly CV: RRR no murmur, no edema, +2/4 P posterior tibialis pulses Chest: CTAB, no wheeze or crackles Abd: Soft. NTND. BS present.no  Masses palpated.  Skin: no rashes, purpura or petechiae.  Neuro: Normal gait. PERLA. EOMi. Alert. Oriented x3 Psych: Normal affect, dress and demeanor. Normal speech. Normal thought content and judgment.   No exam data present  Assessment/plan: Carla Phillips is a 54 y.o. female present for CPE Essential hypertension/obesity -  stable. If  borderline at CPE would increase amlodipine. Doing great- losing weight.  -  Continue  lisinopril 40 mg QD.  -  continue  amlodipine 5mg   - Low-sodium diet, increase exercise greater than 150 minutes a week. - f/u at cpe in 5.5 months.   Return in about 6 months (around 09/03/2020) for CPE (30 min).   No orders of the defined types were placed in this encounter.  Meds ordered this encounter  Medications  . lisinopril (ZESTRIL) 40 MG tablet    Sig: Take 1 tablet (40 mg total) by mouth daily.    Dispense:  90 tablet    Refill:  1  . amLODipine (NORVASC) 5 MG tablet    Sig: Take 1 tablet (5 mg total) by mouth daily.    Dispense:  90 tablet    Refill:  1   Referral Orders  No referral(s) requested today    6 months CMC follow up Electronically signed by: 11/03/2020, DO  Primary [facility]- [place]

## 2020-03-18 NOTE — Patient Instructions (Addendum)
Follow up first week of September for your physical and BP recheck.  We will complete your lab work at that time.    Try xyzal in place of zyrtec for your allergies.

## 2020-10-17 ENCOUNTER — Other Ambulatory Visit: Payer: Self-pay | Admitting: Family Medicine

## 2020-10-17 DIAGNOSIS — I10 Essential (primary) hypertension: Secondary | ICD-10-CM

## 2020-10-20 ENCOUNTER — Other Ambulatory Visit: Payer: Self-pay

## 2020-10-20 ENCOUNTER — Telehealth: Payer: Self-pay | Admitting: Family Medicine

## 2020-10-20 DIAGNOSIS — I10 Essential (primary) hypertension: Secondary | ICD-10-CM

## 2020-10-20 MED ORDER — LISINOPRIL 40 MG PO TABS: 40.0000 mg | ORAL_TABLET | Freq: Every day | ORAL | 1 refills | Status: DC

## 2020-10-20 MED ORDER — AMLODIPINE BESYLATE 5 MG PO TABS: 5.0000 mg | ORAL_TABLET | Freq: Every day | ORAL | 1 refills | Status: DC

## 2020-10-20 NOTE — Telephone Encounter (Signed)
Rx sent to pharmacy   

## 2020-10-20 NOTE — Telephone Encounter (Signed)
Patient returned call. I gave her refill message and she voiced understanding.

## 2020-10-20 NOTE — Telephone Encounter (Signed)
Patient called for refill of lisinopril and amlodipine. She states she is out now.

## 2020-10-21 ENCOUNTER — Other Ambulatory Visit: Payer: Self-pay

## 2020-10-22 ENCOUNTER — Encounter: Payer: 59 | Admitting: Family Medicine

## 2020-10-22 ENCOUNTER — Telehealth: Payer: Self-pay | Admitting: Family Medicine

## 2020-10-22 NOTE — Telephone Encounter (Signed)
Patient requested refill of Lisinopril, states she only has a couple days left. She had to postpone her physical appt due to not feeling well this morning.

## 2020-10-22 NOTE — Telephone Encounter (Signed)
Left VM for pt to pick up medication (sent 10/20/20)

## 2020-11-16 ENCOUNTER — Encounter: Payer: 59 | Admitting: Family Medicine

## 2020-11-30 ENCOUNTER — Encounter: Payer: Self-pay | Admitting: Family Medicine

## 2020-11-30 ENCOUNTER — Ambulatory Visit (INDEPENDENT_AMBULATORY_CARE_PROVIDER_SITE_OTHER): Payer: 59 | Admitting: Family Medicine

## 2020-11-30 ENCOUNTER — Other Ambulatory Visit: Payer: Self-pay

## 2020-11-30 VITALS — BP 139/83 | HR 87 | Temp 98.4°F | Ht 58.25 in | Wt 139.0 lb

## 2020-11-30 DIAGNOSIS — Z1231 Encounter for screening mammogram for malignant neoplasm of breast: Secondary | ICD-10-CM | POA: Diagnosis not present

## 2020-11-30 DIAGNOSIS — Z131 Encounter for screening for diabetes mellitus: Secondary | ICD-10-CM

## 2020-11-30 DIAGNOSIS — E669 Obesity, unspecified: Secondary | ICD-10-CM | POA: Diagnosis not present

## 2020-11-30 DIAGNOSIS — Z0000 Encounter for general adult medical examination without abnormal findings: Secondary | ICD-10-CM | POA: Diagnosis not present

## 2020-11-30 DIAGNOSIS — I10 Essential (primary) hypertension: Secondary | ICD-10-CM

## 2020-11-30 MED ORDER — LISINOPRIL 40 MG PO TABS: 40.0000 mg | ORAL_TABLET | Freq: Every day | ORAL | 1 refills | Status: DC

## 2020-11-30 MED ORDER — AMLODIPINE BESYLATE 10 MG PO TABS: 10.0000 mg | ORAL_TABLET | Freq: Every day | ORAL | 1 refills | Status: DC

## 2020-11-30 NOTE — Progress Notes (Signed)
This visit occurred during the SARS-CoV-2 public health emergency.  Safety protocols were in place, including screening questions prior to the visit, additional usage of staff PPE, and extensive cleaning of exam room while observing appropriate contact time as indicated for disinfecting solutions.    Patient ID: Carla Phillips, female  DOB: 15-Apr-1966, 54 y.o.   MRN: [id number] Patient Care Team    Relationship Specialty Notifications Start End  Natalia Leatherwood, DO PCP - General Family Medicine  10/20/15     Chief Complaint  Patient presents with  . Annual Exam    pt is not fasting    Subjective:  Carla Phillips is a 54 y.o.  Female  present for CPE. All past medical history, surgical history, allergies, family history, immunizations, medications and social history were updated in the electronic medical record today. All recent labs, ED visits and hospitalizations within the last year were reviewed.  Hypertension/obesity: Pt reports compliancewith Lisinopril 40 mg dailyand amlodipine 5 mg QD. Patient denies chest pain, shortness of breath, dizziness or lower extremity edema.  Pt does not take a daily baby ASA. Pt is not prescribed statin. Patient had been on HCTZ 50 mg daily in the past. She never had good control of her blood pressure. She had frequent urination which bothered her.  Diet: Does not watch the sodium in her diet very closely. Exercise: Does not exercise routinely RF: Hypertension, obesity, smoker  Health maintenance:  Colonoscopy: no fhx. cologuard completed 02/2020.Due 2024 Mammogram: completed: 08/23/2019 BCGSO>> ordered today Cervical cancer screening: last pap: 2017, results: NL/NEG HPV, completed by: Claiborne Billings- rpt 5 yrs. [id number] Immunizations: tdap UTD 2016, Influenza declined(encouraged yearly), PNA23 completed.  shingrix declined. covid declined Infectious disease screening: HIV declined today. Hep c declined today DEXA: consider early screen at 19,  smoker Assistive device: rnone Oxygen ACZ:YSAY Patient has a Dental home. Hospitalizations/ED visits: reviewed   Depression screen [facility] 2/9 11/30/2020 08/20/2019 02/27/2019 08/29/2018 01/08/2018  Decreased Interest 0 0 0 0 0  Down, Depressed, Hopeless 0 0 0 0 0  PHQ - 2 Score 0 0 0 0 0   No flowsheet data found.   Immunization History  Administered Date(s) Administered  . Pneumococcal Polysaccharide-23 08/20/2019  . Tdap 10/20/2015    Past Medical History:  Diagnosis Date  . Anemia   . Frequent headaches   . History of blood transfusion   . Hypertension    No Known Allergies Past Surgical History:  Procedure Laterality Date  . TUBAL LIGATION     Family History  Problem Relation Age of Onset  . Arthritis Mother   . COPD Father   . Cancer Neg Hx   . 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.   Current every day smoker. Drinks 3 beers a day. Denies recreational drugs or caffeine use. Denies herbal remedies.   Wear seatbelt. Does not exercise routinely.   Smoke alarm present home.       Allergies as of 11/30/2020   No Known Allergies     Medication List       Accurate as of November 30, 2020  3:28 PM. If you have any questions, ask your nurse or doctor.        amLODipine 10 MG tablet Commonly known as: NORVASC Take 1 tablet (10 mg total) by mouth  daily. What changed:   medication strength  how much to take Changed by: Felix Pacini, DO   lisinopril 40 MG tablet Commonly known as: ZESTRIL Take 1 tablet (40 mg total) by mouth daily.   ZYRTEC PO Take by mouth.       All past medical history, surgical history, allergies, family history, immunizations andmedications were updated in the EMR today and reviewed under the history and medication portions of their EMR.     No results found for this or any  previous visit (from the past 2160 hour(s)).   ROS: 14 pt review of systems performed and negative (unless mentioned in an HPI)  Objective: BP 139/83   Pulse 87   Temp 98.4 F (36.9 C) (Oral)   Ht 4' 10.25" (1.48 m)   Wt 139 lb (63 kg)   SpO2 97%   BMI 28.80 kg/m  Gen: Afebrile. No acute distress. Nontoxic in appearance, well-developed, well-nourished,  Pleasant female. HENT: AT. [place]. Bilateral TM visualized and normal in appearance, normal external auditory canal. MMM, no oral lesions, adequate dentition. Bilateral nares within normal limits. Throat without erythema, ulcerations or exudates. no Cough on exam, no hoarseness on exam. Eyes:Pupils Equal Round Reactive to light, Extraocular movements intact,  Conjunctiva without redness, discharge or icterus. Neck/lymp/endocrine: Supple,no lymphadenopathy, no thyromegaly CV: RRR no murmur, no edema, +2/4 P posterior tibialis pulses.  Chest: CTAB, no wheeze, rhonchi or crackles. normal Respiratory effort. good Air movement. Abd: Soft. flat. NTND. BS present. no Masses palpated. No hepatosplenomegaly. No rebound tenderness or guarding. Skin: no rashes, purpura or petechiae. Warm and well-perfused. Skin intact. Neuro/Msk:  Normal gait. PERLA. EOMi. Alert. Oriented x3.  Cranial nerves II through XII intact. Muscle strength 5/5 upper/lower extremity. DTRs equal bilaterally. Psych: Normal affect, dress and demeanor. Normal speech. Normal thought content and judgment.   No exam data present  Assessment/plan: Carla Phillips is a 54 y.o. female present for CPE Essential hypertension/obesity Has been borderline for the last year.  Continue lisinopril 40 mg qd Increase amlodipine to 10 mg qd - CBC with Differential/Platelet - Comprehensive metabolic panel - Lipid panel - TSH - lisinopril (ZESTRIL) 40 MG tablet; Take 1 tablet (40 mg total) by mouth daily.  Dispense: 90 tablet; Refill: 1 - amLODipine (NORVASC) 10 MG tablet; Take 1 tablet  (10 mg total) by mouth daily.  Dispense: 90 tablet; Refill: 1 F/u 5.5 mos.   Diabetes mellitus screening - Hemoglobin A1c Encounter for screening mammogram for malignant neoplasm of breast - MM 3D SCREEN BREAST BILATERAL; Future Encounter for preventive health examination Patient was encouraged to exercise greater than 150 minutes a week. Patient was encouraged to choose a diet filled with fresh fruits and vegetables, and lean meats. AVS provided to patient today for education/recommendation on gender specific health and safety maintenance. Colonoscopy: no fhx. cologuard completed 02/2020.Due 2024 Mammogram: completed: 08/23/2019 BCGSO>> ordered today Cervical cancer screening: last pap: 2017, results: NL/NEG HPV, completed by: Claiborne Billings- rpt 5 yrs. [id number] Immunizations: tdap UTD 2016, Influenza declined(encouraged yearly), PNA23 completed.  shingrix declined. covid declined Infectious disease screening: HIV declined today. Hep c declined today DEXA: consider early screen at 35, smoker  Return in about 24 weeks (around 05/17/2021) for [facility] (30 min).   Orders Placed This Encounter  Procedures  . MM 3D SCREEN BREAST BILATERAL  . CBC with Differential/Platelet  . Comprehensive metabolic panel  . Hemoglobin A1c  . Lipid panel  . TSH   Meds ordered this encounter  Medications  .  lisinopril (ZESTRIL) 40 MG tablet    Sig: Take 1 tablet (40 mg total) by mouth daily.    Dispense:  90 tablet    Refill:  1  . amLODipine (NORVASC) 10 MG tablet    Sig: Take 1 tablet (10 mg total) by mouth daily.    Dispense:  90 tablet    Refill:  1   Referral Orders  No referral(s) requested today     Electronically signed by: Felix Pacini, DO [facility] [place]

## 2020-11-30 NOTE — Patient Instructions (Signed)
Health Maintenance, Female Adopting a healthy lifestyle and getting preventive care are important in promoting health and wellness. Ask your health care provider about:  The right schedule for you to have regular tests and exams.  Things you can do on your own to prevent diseases and keep yourself healthy. What should I know about diet, weight, and exercise? Eat a healthy diet   Eat a diet that includes plenty of vegetables, fruits, low-fat dairy products, and lean protein.  Do not eat a lot of foods that are high in solid fats, added sugars, or sodium. Maintain a healthy weight Body mass index (BMI) is used to identify weight problems. It estimates body fat based on height and weight. Your health care provider can help determine your BMI and help you achieve or maintain a healthy weight. Get regular exercise Get regular exercise. This is one of the most important things you can do for your health. Most adults should:  Exercise for at least 150 minutes each week. The exercise should increase your heart rate and make you sweat (moderate-intensity exercise).  Do strengthening exercises at least twice a week. This is in addition to the moderate-intensity exercise.  Spend less time sitting. Even light physical activity can be beneficial. Watch cholesterol and blood lipids Have your blood tested for lipids and cholesterol at 53 years of age, then have this test every 5 years. Have your cholesterol levels checked more often if:  Your lipid or cholesterol levels are high.  You are older than 53 years of age.  You are at high risk for heart disease. What should I know about cancer screening? Depending on your health history and family history, you may need to have cancer screening at various ages. This may include screening for:  Breast cancer.  Cervical cancer.  Colorectal cancer.  Skin cancer.  Lung cancer. What should I know about heart disease, diabetes, and high blood  pressure? Blood pressure and heart disease  High blood pressure causes heart disease and increases the risk of stroke. This is more likely to develop in people who have high blood pressure readings, are of African descent, or are overweight.  Have your blood pressure checked: ? Every 3-5 years if you are 53-39 years of age. ? Every year if you are 53 years old or older. Diabetes Have regular diabetes screenings. This checks your fasting blood sugar level. Have the screening done:  Once every three years after age 53 if you are at a normal weight and have a low risk for diabetes.  More often and at a younger age if you are overweight or have a high risk for diabetes. What should I know about preventing infection? Hepatitis B If you have a higher risk for hepatitis B, you should be screened for this virus. Talk with your health care provider to find out if you are at risk for hepatitis B infection. Hepatitis C Testing is recommended for:  Everyone born from 1945 through 1965.  Anyone with known risk factors for hepatitis C. Sexually transmitted infections (STIs)  Get screened for STIs, including gonorrhea and chlamydia, if: ? You are sexually active and are younger than 53 years of age. ? You are older than 53 years of age and your health care provider tells you that you are at risk for this type of infection. ? Your sexual activity has changed since you were last screened, and you are at increased risk for chlamydia or gonorrhea. Ask your health care provider if   you are at risk.  Ask your health care provider about whether you are at high risk for HIV. Your health care provider may recommend a prescription medicine to help prevent HIV infection. If you choose to take medicine to prevent HIV, you should first get tested for HIV. You should then be tested every 3 months for as long as you are taking the medicine. Pregnancy  If you are about to stop having your period (premenopausal) and  you may become pregnant, seek counseling before you get pregnant.  Take 400 to 800 micrograms (mcg) of folic acid every day if you become pregnant.  Ask for birth control (contraception) if you want to prevent pregnancy. Osteoporosis and menopause Osteoporosis is a disease in which the bones lose minerals and strength with aging. This can result in bone fractures. If you are 65 years old or older, or if you are at risk for osteoporosis and fractures, ask your health care provider if you should:  Be screened for bone loss.  Take a calcium or vitamin D supplement to lower your risk of fractures.  Be given hormone replacement therapy (HRT) to treat symptoms of menopause. Follow these instructions at home: Lifestyle  Do not use any products that contain nicotine or tobacco, such as cigarettes, e-cigarettes, and chewing tobacco. If you need help quitting, ask your health care provider.  Do not use street drugs.  Do not share needles.  Ask your health care provider for help if you need support or information about quitting drugs. Alcohol use  Do not drink alcohol if: ? Your health care provider tells you not to drink. ? You are pregnant, may be pregnant, or are planning to become pregnant.  If you drink alcohol: ? Limit how much you use to 0-1 drink a day. ? Limit intake if you are breastfeeding.  Be aware of how much alcohol is in your drink. In the [place] one drink equals one 12 oz bottle of beer (355 mL), one 5 oz glass of wine (148 mL), or one 1 oz glass of hard liquor (44 mL). General instructions  Schedule regular health, dental, and eye exams.  Stay current with your vaccines.  Tell your health care provider if: ? You often feel depressed. ? You have ever been abused or do not feel safe at home. Summary  Adopting a healthy lifestyle and getting preventive care are important in promoting health and wellness.  Follow your health care provider's instructions about healthy  diet, exercising, and getting tested or screened for diseases.  Follow your health care provider's instructions on monitoring your cholesterol and blood pressure. This information is not intended to replace advice given to you by your health care provider. Make sure you discuss any questions you have with your health care provider. Document Revised: 12/05/2018 Document Reviewed: 12/05/2018 Elsevier Patient Education  2020 [employer] Inc[employer]  

## 2020-12-01 LAB — LIPID PANEL
Cholesterol: 187 mg/dL (ref <200)
HDL: 86 mg/dL (ref >=50)
LDL Cholesterol (Calc): 78 mg/dL (calc)
Non-HDL Cholesterol (Calc): 101 mg/dL (calc) (ref <130)
Total CHOL/HDL Ratio: 2.2 (calc) (ref <5.0)
Triglycerides: 133 mg/dL (ref <150)

## 2020-12-01 LAB — COMPREHENSIVE METABOLIC PANEL
AG Ratio: 2.2 (calc) (ref 1.0–2.5)
ALT: 11 U/L (ref 6–29)
AST: 14 U/L (ref 10–35)
Albumin: 4.4 g/dL (ref 3.6–5.1)
Alkaline phosphatase (APISO): 75 U/L (ref 37–153)
BUN: 20 mg/dL (ref 7–25)
CO2: 23 mmol/L (ref 20–32)
Calcium: 9.8 mg/dL (ref 8.6–10.4)
Chloride: 104 mmol/L (ref 98–110)
Creat: 0.74 mg/dL (ref 0.50–1.05)
Globulin: 2 g/dL (calc) (ref 1.9–3.7)
Glucose, Bld: 85 mg/dL (ref 65–99)
Potassium: 4.7 mmol/L (ref 3.5–5.3)
Sodium: 138 mmol/L (ref 135–146)
Total Bilirubin: 0.2 mg/dL (ref 0.2–1.2)
Total Protein: 6.4 g/dL (ref 6.1–8.1)

## 2020-12-01 LAB — CBC WITH DIFFERENTIAL/PLATELET
Absolute Monocytes: 876 cells/uL (ref 200–950)
Basophils Absolute: 51 cells/uL (ref 0–200)
Basophils Relative: 0.7 %
Eosinophils Absolute: 161 cells/uL (ref 15–500)
Eosinophils Relative: 2.2 %
HCT: 39.4 % (ref 35.0–45.0)
Hemoglobin: 13.7 g/dL (ref 11.7–15.5)
Lymphs Abs: 2300 cells/uL (ref 850–3900)
MCH: 34.3 pg — ABNORMAL HIGH (ref 27.0–33.0)
MCHC: 34.8 g/dL (ref 32.0–36.0)
MCV: 98.7 fL (ref 80.0–100.0)
MPV: 9.5 fL (ref 7.5–12.5)
Monocytes Relative: 12 %
Neutro Abs: 3913 cells/uL (ref 1500–7800)
Neutrophils Relative %: 53.6 %
Platelets: 270 10*3/uL (ref 140–400)
RBC: 3.99 10*6/uL (ref 3.80–5.10)
RDW: 11.6 % (ref 11.0–15.0)
Total Lymphocyte: 31.5 %
WBC: 7.3 10*3/uL (ref 3.8–10.8)

## 2020-12-01 LAB — TSH: TSH: 1.05 mIU/L

## 2020-12-01 LAB — HEMOGLOBIN A1C
Hgb A1c MFr Bld: 4.8 % of total Hgb (ref <5.7)
Mean Plasma Glucose: 91 mg/dL
eAG (mmol/L): 5 mmol/L

## 2021-06-09 ENCOUNTER — Other Ambulatory Visit: Payer: Self-pay | Admitting: Family Medicine

## 2021-06-09 DIAGNOSIS — I10 Essential (primary) hypertension: Secondary | ICD-10-CM

## 2021-07-14 ENCOUNTER — Encounter: Payer: Self-pay | Admitting: Family Medicine

## 2021-07-14 ENCOUNTER — Other Ambulatory Visit: Payer: Self-pay

## 2021-07-14 ENCOUNTER — Ambulatory Visit (INDEPENDENT_AMBULATORY_CARE_PROVIDER_SITE_OTHER): Payer: 59 | Admitting: Family Medicine

## 2021-07-14 ENCOUNTER — Other Ambulatory Visit: Payer: Self-pay | Admitting: Family Medicine

## 2021-07-14 VITALS — BP 121/70 | HR 98 | Temp 98.5°F | Ht <= 58 in | Wt 145.0 lb

## 2021-07-14 DIAGNOSIS — E669 Obesity, unspecified: Secondary | ICD-10-CM | POA: Diagnosis not present

## 2021-07-14 DIAGNOSIS — I10 Essential (primary) hypertension: Secondary | ICD-10-CM

## 2021-07-14 MED ORDER — LISINOPRIL 40 MG PO TABS: 40.0000 mg | ORAL_TABLET | Freq: Every day | ORAL | 1 refills | Status: DC

## 2021-07-14 MED ORDER — AMLODIPINE BESYLATE 10 MG PO TABS: 10.0000 mg | ORAL_TABLET | Freq: Every day | ORAL | 1 refills | Status: DC

## 2021-07-14 NOTE — Patient Instructions (Addendum)
Blood pressure looks GREAT! Next appt schedule as your physical beginning of January. We will perform (fasting if able) labs that appt.    Great to see you today.  I have refilled the medication(s) we provide.

## 2021-07-14 NOTE — Progress Notes (Signed)
This visit occurred during the SARS-CoV-2 public health emergency.  Safety protocols were in place, including screening questions prior to the visit, additional usage of staff PPE, and extensive cleaning of exam room while observing appropriate contact time as indicated for disinfecting solutions.    Patient ID: Carla Phillips, female  DOB: [birth year], 55 y.o.   MRN: [id number] Patient Care Team    Relationship Specialty Notifications Start End  Natalia Leatherwood, DO PCP - General Family Medicine  10/20/15     Chief Complaint  Patient presents with   Hypertension    CMC; pt is fasting    Subjective: Carla Phillips is a 55 y.o.  Female  present for [facility] Hypertension/obesity: Pt reports compliance with Lisinopril 40 mg daily and amlodipine 10 mg QD. Patient denies chest pain, shortness of breath, dizziness or lower extremity edema.  Pt does not take a daily baby ASA. Pt is not prescribed statin. Patient had been on HCTZ 50 mg daily in the past. She never had good control of her blood pressure. She had frequent urination which bothered her.  Diet: Does not watch the sodium in her diet very closely. Exercise: Does not exercise routinely RF: Hypertension, obesity, smoker    Depression screen [facility] 2/9 11/30/2020 08/20/2019 02/27/2019 08/29/2018 01/08/2018  Decreased Interest 0 0 0 0 0  Down, Depressed, Hopeless 0 0 0 0 0  PHQ - 2 Score 0 0 0 0 0   No flowsheet data found.   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.   Current every day smoker. Drinks 3 beers a day. Denies recreational drugs or caffeine use. Denies herbal remedies.   Wear seatbelt. Does not exercise routinely.   Smoke alarm present home.       Allergies as of 07/14/2021   No Known Allergies      Medication List        Accurate as of July 14, 2021  3:13 PM. If you have any questions, ask your nurse or doctor.          STOP taking these medications    ZYRTEC PO Stopped by: Felix Pacini, DO       TAKE these medications    amLODipine 10 MG tablet Commonly known as: NORVASC Take 1 tablet (10 mg total) by mouth daily.   levocetirizine 5 MG tablet Commonly known as: XYZAL Take 5 mg by mouth every evening.   lisinopril 40 MG tablet Commonly known as: ZESTRIL Take 1 tablet (40 mg total) by mouth daily.        All past medical history, surgical history, allergies, family history, immunizations andmedications were updated in the EMR today and reviewed under the history and medication portions of their EMR.     No results found for this or  any previous visit (from the past 2160 hour(s)).   ROS: 14 pt review of systems performed and negative (unless mentioned in an HPI)  Objective: BP 121/70   Pulse 98   Temp 98.5 F (36.9 C) (Oral)   Ht 4\' 10"  (1.473 m)   Wt 145 lb (65.8 kg)   SpO2 98%   BMI 30.31 kg/m  Gen: Afebrile. No acute distress. Nontoxic, pleasant female.  HENT: AT. [place]. No cough or hoarseness.  Eyes:Pupils Equal Round Reactive to light, Extraocular movements intact,  Conjunctiva without redness, discharge or icterus. CV: RRR no murmur, no edema, +2/4 P posterior tibialis pulses Chest: CTAB, no wheeze or crackles Skin: no rashes, purpura or petechiae.  Neuro:  Normal gait. PERLA. EOMi. Alert. Oriented x3 Psych: Normal affect, dress and demeanor. Normal speech. Normal  thought content and judgment.   No results found.  Assessment/plan: Carla Phillips is a 55 y.o. female present for [facility] Essential hypertension/obesity Stable.  Continue lisinopril 40 mg qd Continue amlodipine to 10 mg qd Low sodium diet - routine exercise.  Labs due next visit F/u 5.5 mos.    Return in about 6 months (around 01/03/2022) for CPE (30 min), CMC (30 min).   No orders of the defined types were placed in this encounter.  Meds ordered this encounter  Medications   lisinopril (ZESTRIL) 40 MG tablet    Sig: Take 1 tablet (40 mg total) by mouth daily.    Dispense:  90 tablet    Refill:  1   amLODipine (NORVASC) 10 MG tablet    Sig: Take 1 tablet (10 mg total) by mouth daily.    Dispense:  90 tablet    Refill:  1    Referral Orders  No referral(s) requested today     Electronically signed by: 03/03/2022, DO [facility]

## 2021-07-16 ENCOUNTER — Ambulatory Visit: Payer: 59 | Admitting: Family Medicine

## 2021-08-13 ENCOUNTER — Other Ambulatory Visit: Payer: Self-pay

## 2021-08-13 ENCOUNTER — Ambulatory Visit
Admission: RE | Admit: 2021-08-13 | Discharge: 2021-08-13 | Disposition: A | Discharge status: Home / Self Care | Payer: 59 | Source: Ambulatory Visit | Attending: Family Medicine | Admitting: Family Medicine

## 2021-08-13 DIAGNOSIS — Z1231 Encounter for screening mammogram for malignant neoplasm of breast: Secondary | ICD-10-CM

## 2021-10-30 IMAGING — MG MM DIGITAL SCREENING BILAT W/ TOMO AND CAD
6 of 10 series · 6 of 30 positions shown · non-contrast
Comparison: Previous exam(s).

CLINICAL DATA: Screening.

EXAM:
DIGITAL SCREENING BILATERAL MAMMOGRAM WITH TOMOSYNTHESIS AND CAD
TECHNIQUE: Bilateral screening digital craniocaudal and mediolateral oblique
mammograms were obtained. Bilateral screening digital breast
tomosynthesis was performed. The images were evaluated with
computer-aided detection.

[L MLO synth-2D (1 of 2)]
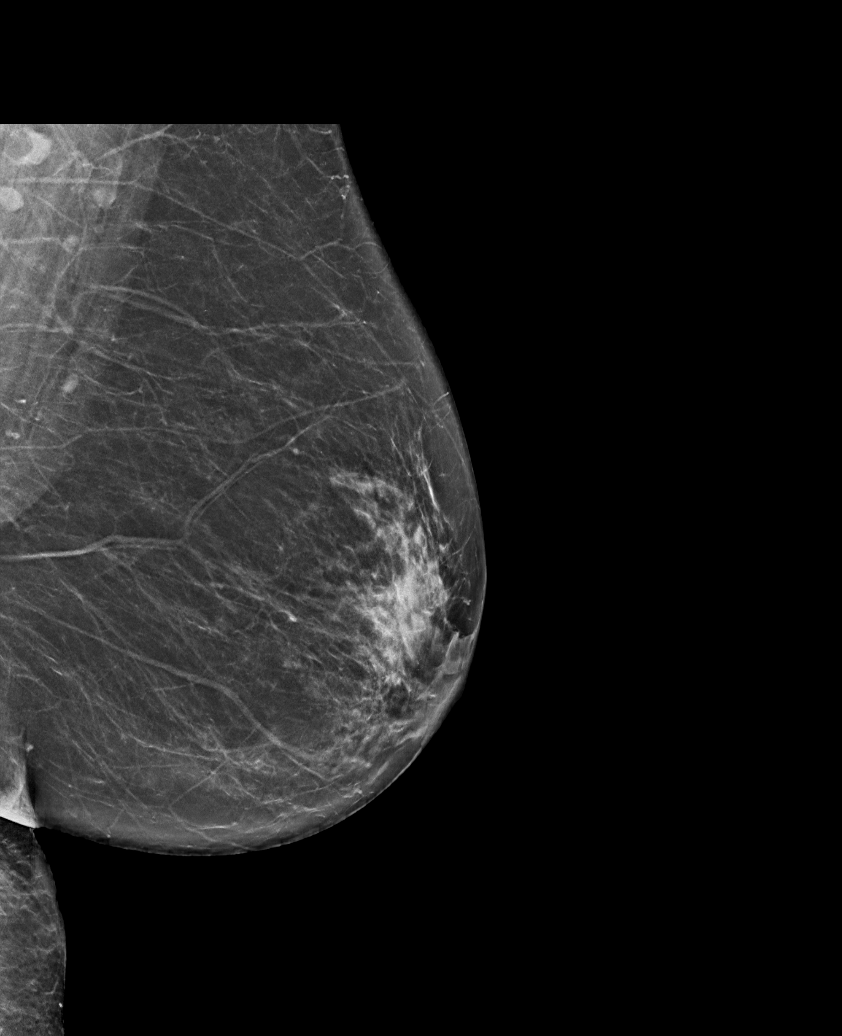

[R CC synth-2D]
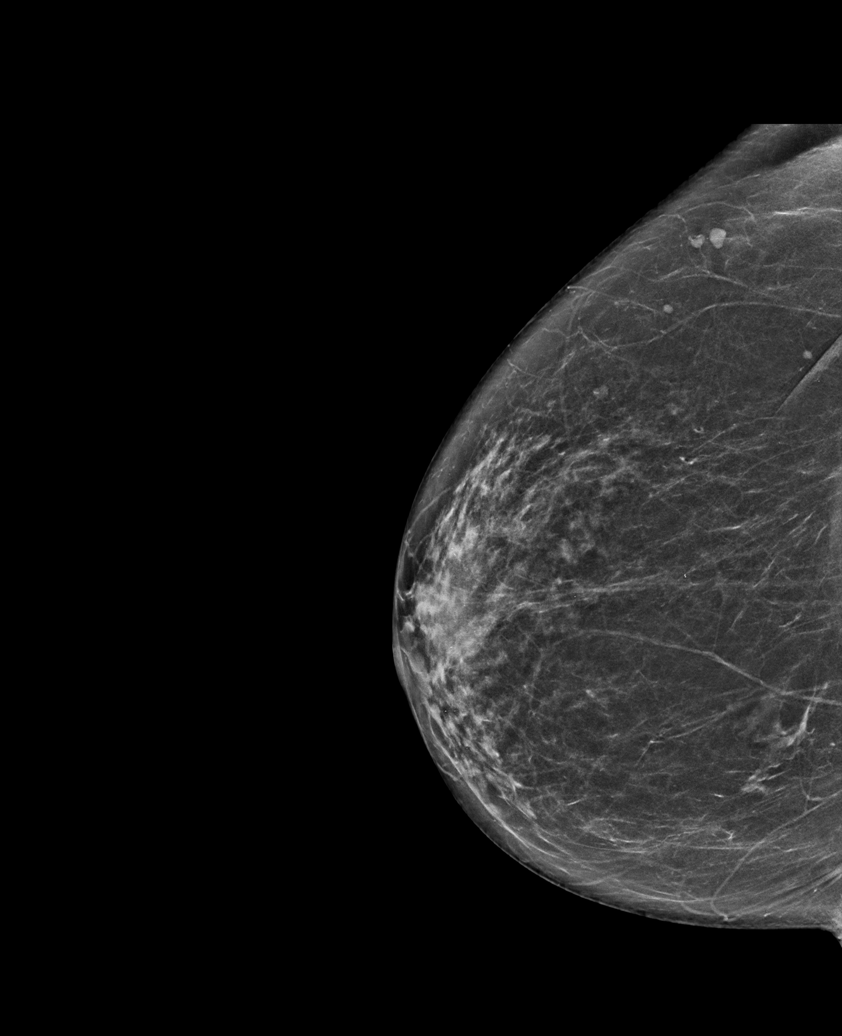

[L MLO synth-2D (2 of 2)]
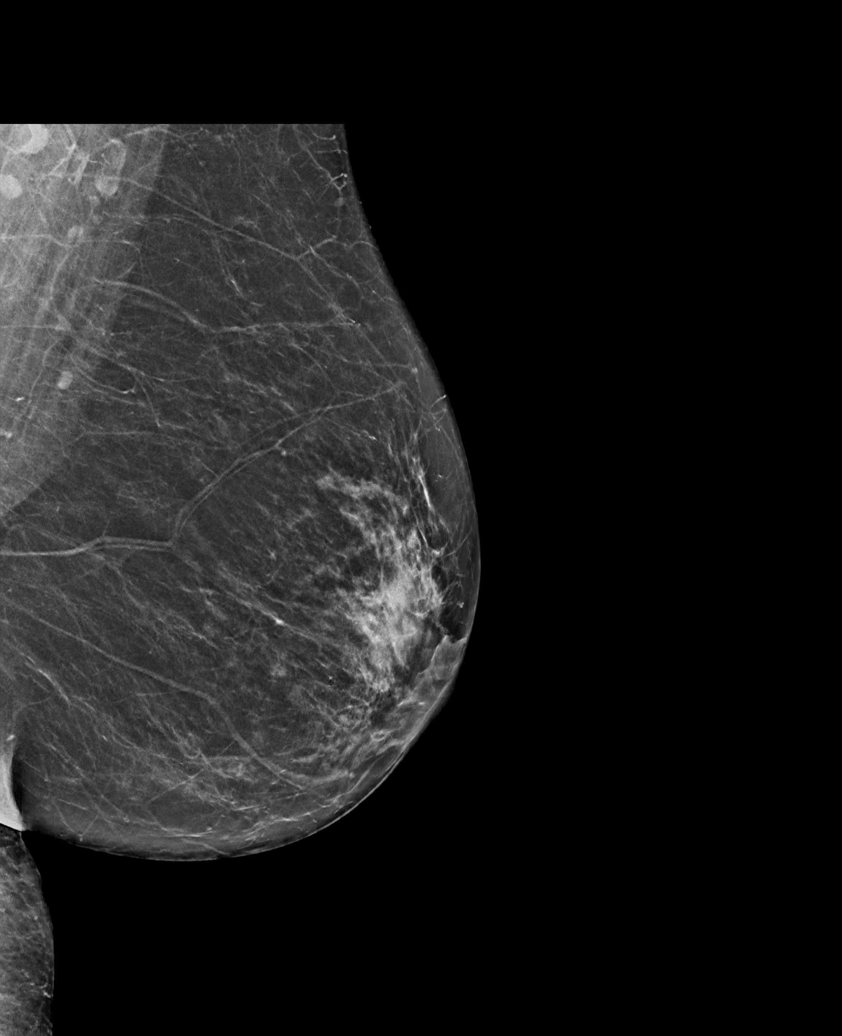

[R MLO synth-2D]
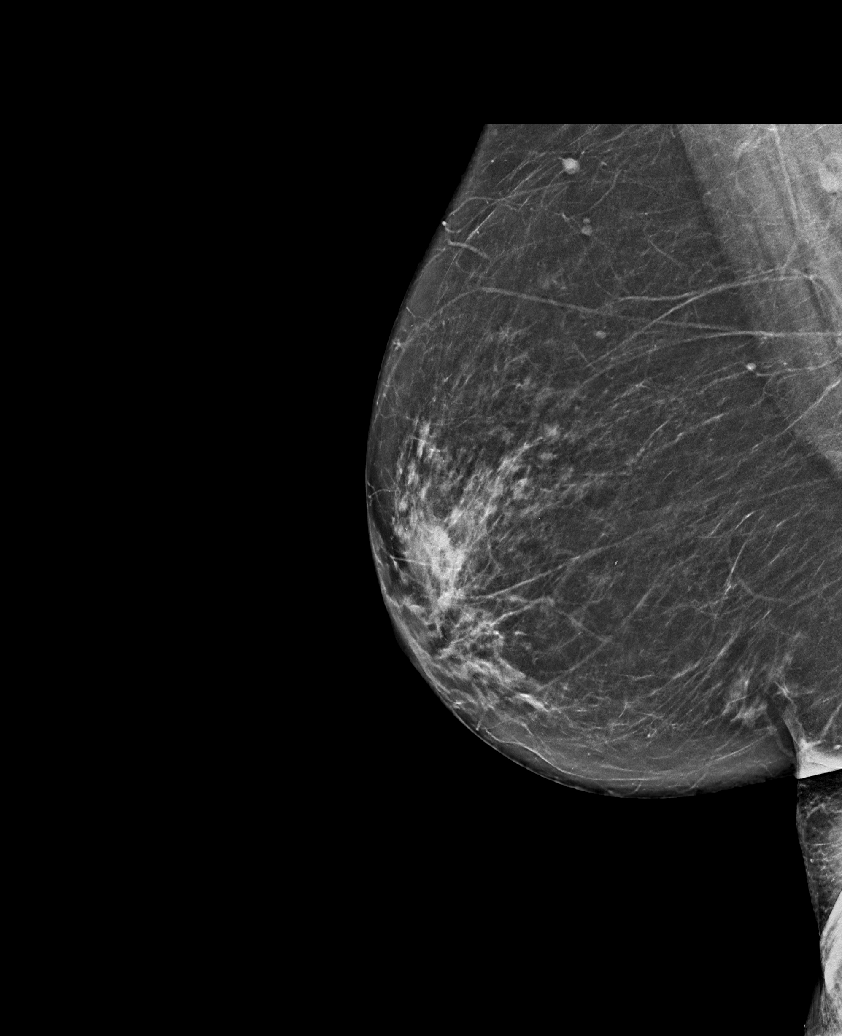

[L CC synth-2D]
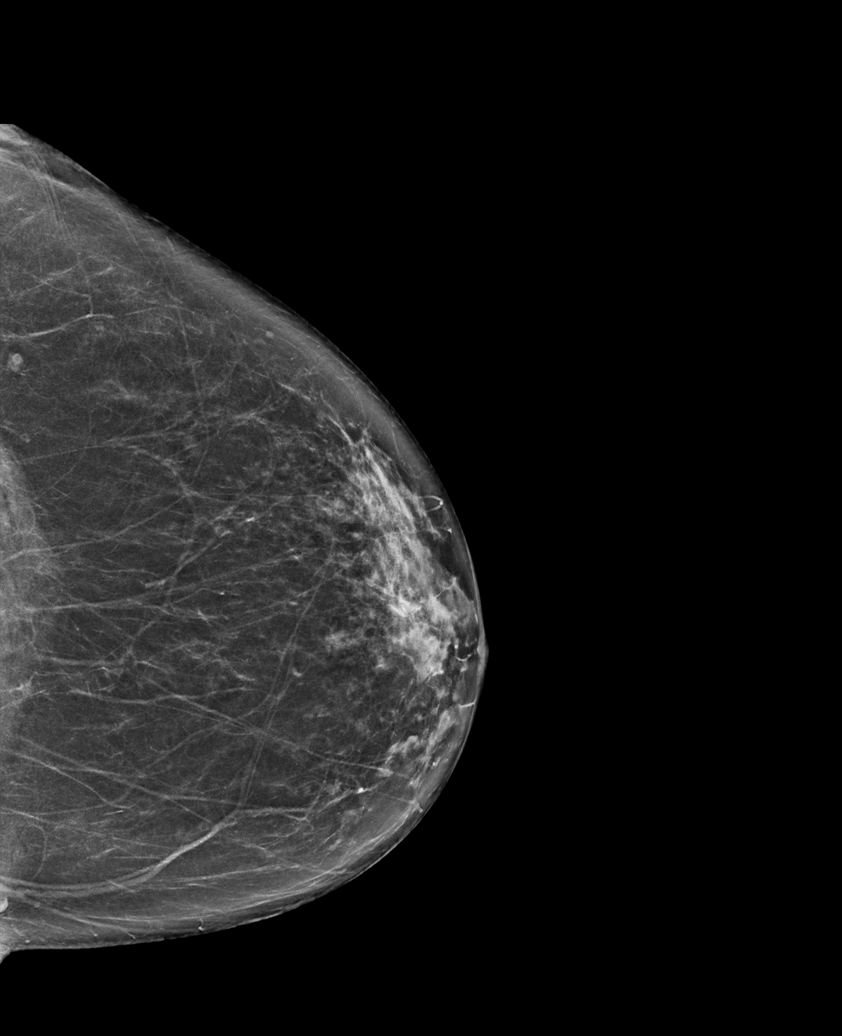

[R CC tomo · tomo slice 35/69.0]
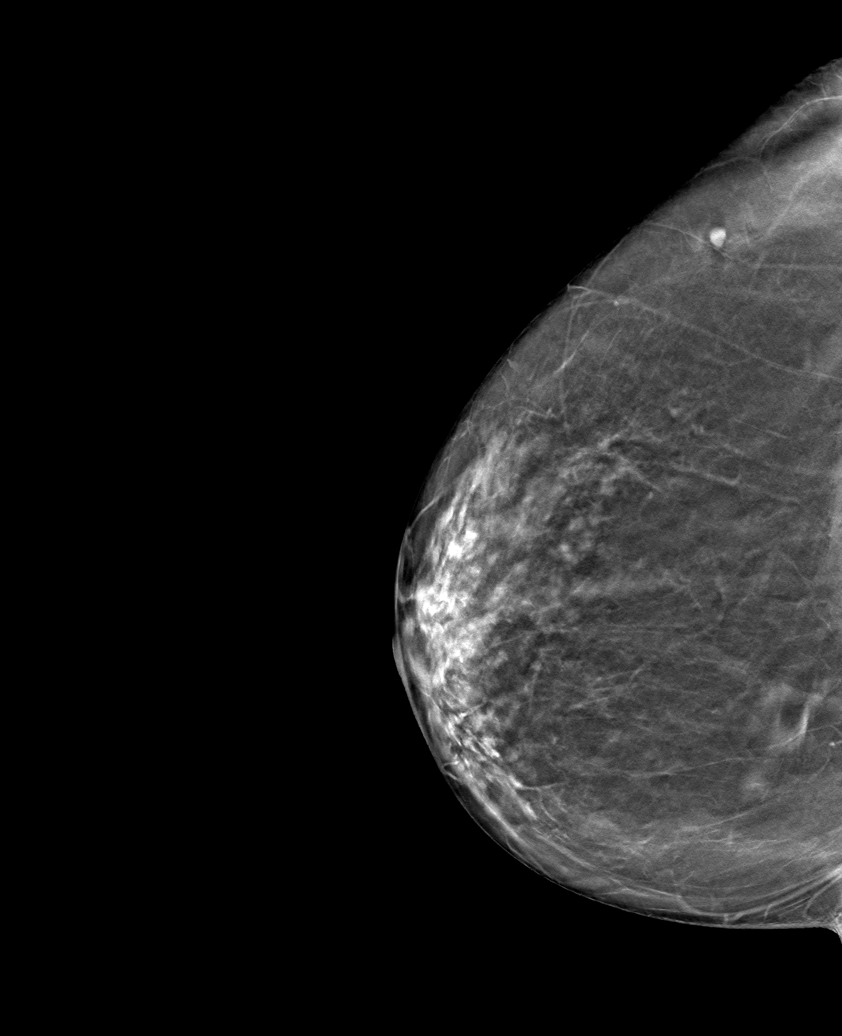

[6 of 30 positions shown; findings below may reference images not displayed]

ACR Breast Density Category b: There are scattered areas of
fibroglandular density.
FINDINGS: There are no findings suspicious for malignancy.
IMPRESSION: No mammographic evidence of malignancy. A result letter of this
screening mammogram will be mailed directly to the patient.

RECOMMENDATION:
Screening mammogram in one year. (Code:51-O-LD2)

BI-RADS CATEGORY  1: Negative.

## 2021-11-05 ENCOUNTER — Encounter: Payer: Self-pay | Admitting: Registered Nurse

## 2021-11-05 ENCOUNTER — Telehealth (INDEPENDENT_AMBULATORY_CARE_PROVIDER_SITE_OTHER): Payer: 59 | Admitting: Registered Nurse

## 2021-11-05 ENCOUNTER — Other Ambulatory Visit: Payer: Self-pay

## 2021-11-05 DIAGNOSIS — R051 Acute cough: Secondary | ICD-10-CM | POA: Diagnosis not present

## 2021-11-05 MED ORDER — HYDROCODONE BIT-HOMATROP MBR 5-1.5 MG/5ML PO SOLN
5.0000 mL | Freq: Every evening | ORAL | 0 refills | Status: DC | PRN
Start: 2021-11-05 — End: 2022-01-04

## 2021-11-05 MED ORDER — AZITHROMYCIN 250 MG PO TABS: ORAL_TABLET | ORAL | 0 refills | Status: AC

## 2021-11-05 MED ORDER — DM-GUAIFENESIN ER 30-600 MG PO TB12: 1.0000 | ORAL_TABLET | Freq: Two times a day (BID) | ORAL | 0 refills | Status: DC

## 2021-11-05 MED ORDER — PREDNISONE 10 MG PO TABS: ORAL_TABLET | ORAL | 0 refills | Status: AC

## 2021-11-05 MED ORDER — BENZONATATE 100 MG PO CAPS: 100.0000 mg | ORAL_CAPSULE | Freq: Three times a day (TID) | ORAL | 0 refills | Status: DC | PRN

## 2021-11-05 NOTE — Progress Notes (Signed)
Telemedicine Encounter- SOAP NOTE Established Patient  This telephone encounter was conducted with the patient's (or proxy's) verbal consent via audio telecommunications: yes/no: Yes Patient was instructed to have this encounter in a suitably private space; and to only have persons present to whom they give permission to participate. In addition, patient identity was confirmed by use of name plus two identifiers (DOB and address).  I discussed the limitations, risks, security and privacy concerns of performing an evaluation and management service by telephone and the availability of in person appointments. I also discussed with the patient that there may be a patient responsible charge related to this service. The patient expressed understanding and agreed to proceed.  I spent a total of 16 minutes talking with the patient or their proxy.  Patient at home Provider in office  Participants: Carla Sportsman, NP and Ocie Bob  Chief Complaint  Patient presents with   Nasal Congestion    Patient states she is having some nasal and chest congestion, cough, ear pain for 2 days. Pt has been taking some OTC medication today.    Subjective   Carla Phillips is a 55 y.o. established patient. Telephone visit today for nasal congestion  HPI Nasal congestion settling down into chest. Chest tightness Cough - scant mucus, but feels she needs to produce Ear pain - no drainage, changes to hearing.  Some fevers, chills, fatigues. Hasnt taken temp but is fairly certain .  Taking mucinex and delsym. Unsure if this is helping or not   30 pack year hx of smoking, no known copd  Patient Active Problem List   Diagnosis Date Noted   Obesity (BMI 30-39.9) 01/08/2018   Essential hypertension 10/20/2015   Tobacco abuse 10/20/2015   Perimenopausal 10/20/2015    Past Medical History:  Diagnosis Date   Anemia    Frequent headaches    History of blood transfusion    Hypertension      Current Outpatient Medications  Medication Sig Dispense Refill   amLODipine (NORVASC) 10 MG tablet Take 1 tablet (10 mg total) by mouth daily. 90 tablet 1   azithromycin (ZITHROMAX) 250 MG tablet Take 2 tablets on day 1, then 1 tablet daily on days 2 through 5. Start if symptoms persist or worsen for 3-4 more days 6 tablet 0   benzonatate (TESSALON) 100 MG capsule Take 1 capsule (100 mg total) by mouth 3 (three) times daily as needed for cough. 20 capsule 0   dextromethorphan-guaiFENesin (MUCINEX DM) 30-600 MG 12hr tablet Take 1 tablet by mouth 2 (two) times daily. 20 tablet 0   HYDROcodone bit-homatropine (HYCODAN) 5-1.5 MG/5ML syrup Take 5 mLs by mouth at bedtime as needed for cough. 120 mL 0   levocetirizine (XYZAL) 5 MG tablet Take 5 mg by mouth every evening.     lisinopril (ZESTRIL) 40 MG tablet Take 1 tablet (40 mg total) by mouth daily. 90 tablet 1   predniSONE (DELTASONE) 10 MG tablet Take 3 tablets (30 mg total) by mouth daily with breakfast for 3 days, THEN 2 tablets (20 mg total) daily with breakfast for 3 days, THEN 1 tablet (10 mg total) daily with breakfast for 3 days. 18 tablet 0   No current facility-administered medications for this visit.    No Known Allergies  Social History   Socioeconomic History   Marital status: Single    Spouse name: Not on file   Number of children: Not on file   Years of education: Not on  file   Highest education level: Not on file  Occupational History   Not on file  Tobacco Use   Smoking status: Every Day    Packs/day: 1.00    Years: 30.00    Pack years: 30.00    Types: Cigarettes   Smokeless tobacco: Never  Vaping Use   Vaping Use: Never used  Substance and Sexual Activity   Alcohol use: Yes    Comment: daily; 3 beers a day   Drug use: No   Sexual activity: Yes  Other Topics Concern   Not on file  Social History Narrative   Hair dresser, 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.   Current every day smoker. Drinks 3 beers a day. Denies recreational drugs or caffeine use. Denies herbal remedies.   Wear seatbelt. Does not exercise routinely.   Smoke alarm present home.      Social Determinants of Health   Financial Resource Strain: Not on file  Food Insecurity: Not on file  Transportation Needs: Not on file  Physical Activity: Not on file  Stress: Not on file  Social Connections: Not on file  Intimate Partner Violence: Not on file    Review of Systems  Constitutional:  Positive for fever.  HENT:  Positive for congestion.   Eyes: Negative.   Respiratory:  Positive for cough, sputum production and shortness of breath.   Cardiovascular: Negative.   Gastrointestinal: Negative.   Genitourinary: Negative.   Musculoskeletal: Negative.   Skin: Negative.   Neurological: Negative.   Endo/Heme/Allergies: Negative.   Psychiatric/Behavioral: Negative.    All other systems reviewed and are negative.  Objective   Vitals as reported by the patient: There were no vitals filed for this visit.  [name] was seen today for nasal congestion.  Diagnoses and all orders for this visit:  Acute cough -     predniSONE (DELTASONE) 10 MG tablet; Take 3 tablets (30 mg total) by mouth daily with breakfast for 3 days, THEN 2 tablets (20 mg total) daily with breakfast for 3 days, THEN 1 tablet (10 mg total) daily with breakfast for 3 days. -     dextromethorphan-guaiFENesin (MUCINEX DM) 30-600 MG 12hr tablet; Take 1 tablet by mouth 2 (two) times daily. -     benzonatate (TESSALON) 100 MG capsule; Take 1 capsule (100 mg total) by mouth 3 (three) times daily as needed for cough. -     HYDROcodone bit-homatropine (HYCODAN) 5-1.5 MG/5ML syrup; Take 5 mLs by mouth at bedtime as needed for cough. -     azithromycin (ZITHROMAX) 250 MG tablet; Take 2 tablets on day 1, then 1 tablet daily on days 2 through 5. Start if symptoms persist or worsen for 3-4  more days   PLAN Supportive care and prednisone as above. Suggest home covid test. Suspect viral illness at this time. If symptoms persist, pt to start z pack on Tuesday. Low threshold for presentation to urgent care or ER with persistent or worsening symptoms. Consider imaging with persistent symptoms given smoking history Patient encouraged to call clinic with any questions, comments, or concerns.  I discussed the assessment and treatment plan with the patient. The patient was provided an opportunity to ask questions and all were answered. The patient agreed with the plan and demonstrated an understanding of the instructions.   The patient was advised to call back or seek an in-person evaluation if the symptoms worsen or if  the condition fails to improve as anticipated.  I provided 16 minutes of non-face-to-face time during this encounter.  Janeece Agee, NP

## 2021-11-10 ENCOUNTER — Telehealth: Payer: Self-pay

## 2021-11-10 NOTE — Telephone Encounter (Signed)
Caller name:Lashona Mayford Knife   On DPR? :yes  Call back [phone]  Provider they see: Gerlene Burdock   Reason for call:Pt did a mychart video visit last Friday with Gerlene Burdock and pt is still sick and wants a work note all this week and return on Monday

## 2021-11-10 NOTE — Telephone Encounter (Signed)
Would be fine - thank you for writing this  Thanks,  Luan Pulling

## 2021-11-10 NOTE — Telephone Encounter (Signed)
Patient was seen by you last Friday and would like a note for this whole week because she is not feeling better at this time. Is it appropriate for me to write this patient out this whole week I will send to her mychart.

## 2021-11-11 ENCOUNTER — Telehealth: Payer: Self-pay

## 2021-11-11 NOTE — Telephone Encounter (Signed)
Called the patient to let her know that Janeece Agee wrote the letter for her. Pt doesn't have Mychart so she said that she might get it printed at [facility]. She is also not 100% better so she will see if her PCP have appointments today.

## 2021-12-31 ENCOUNTER — Other Ambulatory Visit: Payer: Self-pay

## 2022-01-04 ENCOUNTER — Other Ambulatory Visit: Payer: Self-pay

## 2022-01-04 ENCOUNTER — Encounter: Payer: Self-pay | Admitting: Family Medicine

## 2022-01-04 ENCOUNTER — Ambulatory Visit (INDEPENDENT_AMBULATORY_CARE_PROVIDER_SITE_OTHER): Payer: 59 | Admitting: Family Medicine

## 2022-01-04 ENCOUNTER — Other Ambulatory Visit (HOSPITAL_COMMUNITY)
Admission: RE | Admit: 2022-01-04 | Discharge: 2022-01-04 | Disposition: A | Discharge status: Home / Self Care | Payer: 59 | Source: Ambulatory Visit | Attending: Family Medicine | Admitting: Family Medicine

## 2022-01-04 VITALS — BP 109/75 | HR 81 | Temp 98.3°F | Ht <= 58 in | Wt 145.0 lb

## 2022-01-04 DIAGNOSIS — Z124 Encounter for screening for malignant neoplasm of cervix: Secondary | ICD-10-CM | POA: Insufficient documentation

## 2022-01-04 DIAGNOSIS — Z131 Encounter for screening for diabetes mellitus: Secondary | ICD-10-CM | POA: Diagnosis not present

## 2022-01-04 DIAGNOSIS — Z720 Tobacco use: Secondary | ICD-10-CM

## 2022-01-04 DIAGNOSIS — Z01419 Encounter for gynecological examination (general) (routine) without abnormal findings: Secondary | ICD-10-CM

## 2022-01-04 DIAGNOSIS — I10 Essential (primary) hypertension: Secondary | ICD-10-CM

## 2022-01-04 DIAGNOSIS — E669 Obesity, unspecified: Secondary | ICD-10-CM

## 2022-01-04 DIAGNOSIS — E2839 Other primary ovarian failure: Secondary | ICD-10-CM

## 2022-01-04 DIAGNOSIS — Z23 Encounter for immunization: Secondary | ICD-10-CM | POA: Diagnosis not present

## 2022-01-04 DIAGNOSIS — Z1231 Encounter for screening mammogram for malignant neoplasm of breast: Secondary | ICD-10-CM

## 2022-01-04 LAB — LIPID PANEL
Cholesterol: 222 mg/dL — ABNORMAL HIGH (ref 0–200)
HDL: 98.9 mg/dL (ref >39.00)
LDL Cholesterol: 109 mg/dL — ABNORMAL HIGH (ref 0–99)
NonHDL: 122.96
Total CHOL/HDL Ratio: 2
Triglycerides: 68 mg/dL (ref 0.0–149.0)
VLDL: 13.6 mg/dL (ref 0.0–40.0)

## 2022-01-04 LAB — COMPREHENSIVE METABOLIC PANEL
ALT: 14 U/L (ref 0–35)
AST: 16 U/L (ref 0–37)
Albumin: 4.7 g/dL (ref 3.5–5.2)
Alkaline Phosphatase: 89 U/L (ref 39–117)
BUN: 9 mg/dL (ref 6–23)
CO2: 28 mEq/L (ref 19–32)
Calcium: 9.9 mg/dL (ref 8.4–10.5)
Chloride: 102 mEq/L (ref 96–112)
Creatinine, Ser: 0.67 mg/dL (ref 0.40–1.20)
GFR: 98.7 mL/min (ref >60.00)
Glucose, Bld: 76 mg/dL (ref 70–99)
Potassium: 4.3 mEq/L (ref 3.5–5.1)
Sodium: 137 mEq/L (ref 135–145)
Total Bilirubin: 0.6 mg/dL (ref 0.2–1.2)
Total Protein: 6.9 g/dL (ref 6.0–8.3)

## 2022-01-04 LAB — CBC
HCT: 44.2 % (ref 36.0–46.0)
Hemoglobin: 14.5 g/dL (ref 12.0–15.0)
MCHC: 32.8 g/dL (ref 30.0–36.0)
MCV: 100.3 fl — ABNORMAL HIGH (ref 78.0–100.0)
Platelets: 321 10*3/uL (ref 150.0–400.0)
RBC: 4.41 Mil/uL (ref 3.87–5.11)
RDW: 13.8 % (ref 11.5–15.5)
WBC: 8.1 10*3/uL (ref 4.0–10.5)

## 2022-01-04 LAB — TSH: TSH: 1.25 u[IU]/mL (ref 0.35–5.50)

## 2022-01-04 LAB — HEMOGLOBIN A1C: Hgb A1c MFr Bld: 5.3 % (ref 4.6–6.5)

## 2022-01-04 MED ORDER — AMLODIPINE BESYLATE 10 MG PO TABS: 10.0000 mg | ORAL_TABLET | Freq: Every day | ORAL | 1 refills | Status: DC

## 2022-01-04 MED ORDER — LISINOPRIL 40 MG PO TABS: 40.0000 mg | ORAL_TABLET | Freq: Every day | ORAL | 1 refills | Status: DC

## 2022-01-04 NOTE — Progress Notes (Signed)
This visit occurred during the SARS-CoV-2 public health emergency.  Safety protocols were in place, including screening questions prior to the visit, additional usage of staff PPE, and extensive cleaning of exam room while observing appropriate contact time as indicated for disinfecting solutions.    Patient ID: Carla Phillips Rodriguez, female  DOB: Mar 15, 1966, 56 y.o.   MRN: [id number] Patient Care Team    Relationship Specialty Notifications Start End  Natalia LeatherwoodKuneff, Chany Woolworth A, DO PCP - General Family Medicine  10/20/15     Chief Complaint  Patient presents with   Annual Exam    Pt is fasting    Subjective: Carla Phillips Meals is a 56 y.o.  Female  present for CPE [facility]. All past medical history, surgical history, allergies, family history, immunizations, medications and social history were updated in the electronic medical record today. All recent labs, ED visits and hospitalizations within the last year were reviewed.  Hypertension/obesity: Pt reports compliance with Lisinopril 40 mg daily and amlodipine 10 mg QD. Patient denies chest pain, shortness of breath, dizziness or lower extremity edema.  Pt does not take a daily baby ASA. Pt is not prescribed statin. Patient had been on HCTZ 50 mg daily in the past. She never had good control of her blood pressure. She had frequent urination which bothered her.  Diet: Does not watch the sodium in her diet very closely. Exercise: Does not exercise routinely RF: Hypertension, obesity, smoker   Health maintenance:  Colonoscopy: no fhx. cologuard completed 02/2020.Due 2024 Mammogram: completed: 08/13/2021 BCGSO>> ordered today for 2023 Cervical cancer screening: last pap: 2017, results: NL/NEG HPV, completed by: Claiborne BillingsKuneff- rpt 5 yrs. [id number]> completed today Immunizations: tdap UTD 2016, Influenza declined(encouraged yearly), PNA23 completed- could consider PNA20 next year.  shingrix series started today (nurse visit 3 mos for #2). covid declined Infectious  disease screening: HIV declined today. Hep c declined today DEXA: estrogen def, smoker> screening ordered today for aug 2023 Assistive device: none Oxygen ZHY:QMVHuse:none Patient has a Dental home. Hospitalizations/ED visits: reviewed  [facility] 11/05/2021 11/30/2020 08/20/2019 02/27/2019 08/29/2018  Decreased Interest 0 0 0 0 0  Down, Depressed, Hopeless 0 0 0 0 0  PHQ - 2 Score 0 0 0 0 0  Altered sleeping 0 - - - -  Tired, decreased energy 0 - - - -  Change in appetite 0 - - - -  Feeling bad or failure about yourself  0 - - - -  Trouble concentrating 0 - - - -  Moving slowly or fidgety/restless 0 - - - -  Suicidal thoughts 0 - - - -  PHQ-9 Score 0 - - - -  Difficult doing work/chores Not difficult at all - - - -   No flowsheet data found.         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   [place]year [place] [employer]   Current every day smoker. Drinks 3 beers a day. Denies recreational drugs or caffeine use. Denies herbal remedies.   Wear seatbelt. Does not exercise routinely.   Smoke alarm present home.       Allergies as of 01/04/2022   No Known Allergies      Medication List        Accurate as of January 04, 2022 11:13 AM. If you have any questions, ask your nurse or doctor.          STOP taking these medications    benzonatate 100 MG  capsule Commonly known as: TESSALON Stopped by: Felix Pacinienee Darlinda Bellows, DO   dextromethorphan-guaiFENesin 30-600 MG 12hr tablet Commonly known as: MUCINEX DM Stopped by: Felix Pacinienee Aldea Avis, DO   HYDROcodone bit-homatropine 5-1.5 MG/5ML syrup Commonly known as: HYCODAN Stopped by: Felix Pacinienee Lesleigh Hughson, DO   levocetirizine 5 MG tablet Commonly known as: XYZAL Stopped by: Felix Pacinienee Delawrence Fridman, DO       TAKE these medications    amLODipine 10 MG tablet Commonly known as: NORVASC Take 1 tablet (10 mg total) by mouth daily.   cetirizine 10 MG tablet Commonly known as: ZYRTEC Take 10 mg by mouth daily.   lisinopril 40 MG tablet Commonly known as: ZESTRIL Take 1 tablet (40 mg total) by mouth daily.        All past medical history, surgical history, allergies, family history, immunizations andmedications were updated in the EMR today and reviewed under the history and medication portions of their EMR.     No results found for this or any previous visit (from the past 2160 hour(s)).  MM 3D SCREEN BREAST BILATERAL  Result Date: 08/16/2021 CLINICAL DATA:  Screening. EXAM: DIGITAL SCREENING BILATERAL MAMMOGRAM WITH TOMOSYNTHESIS AND CAD TECHNIQUE: Bilateral screening digital craniocaudal and mediolateral oblique mammograms were obtained. Bilateral screening digital breast tomosynthesis was performed. The images were evaluated with computer-aided detection. COMPARISON:  Previous exam(s). ACR Breast Density Category b: There are scattered areas of fibroglandular density. FINDINGS: There are no findings suspicious for malignancy. IMPRESSION: No mammographic evidence of malignancy. A result letter of this screening mammogram will be mailed directly to the patient. RECOMMENDATION: Screening mammogram in one year. (Code:SM-B-01Y) BI-RADS CATEGORY  1: Negative. Electronically Signed   By: Norva PavlovElizabeth  Brown M.D.   On: 08/16/2021 15:49     ROS 14 pt review of systems performed and negative (unless mentioned in an  HPI)  Objective:  BP 109/75    Pulse 81    Temp 98.3 F (36.8 C) (Oral)    Ht 4\' 10"  (1.473 m)    Wt 145 lb (65.8 kg)    SpO2 98%    BMI 30.31 kg/m   Physical Exam Vitals and nursing note reviewed.  Constitutional:      General: She is not in acute distress.    Appearance: Normal appearance. She is not ill-appearing or toxic-appearing.  HENT:     Head: Normocephalic and atraumatic.     Right Ear: Tympanic membrane, ear canal and external ear normal. There is no impacted cerumen.     Left Ear: Tympanic membrane, ear canal and external ear normal. There is no impacted cerumen.     Nose: No congestion or rhinorrhea.     Mouth/Throat:     Mouth: Mucous membranes are moist.     Pharynx: Oropharynx is clear. No oropharyngeal exudate or posterior oropharyngeal erythema.  Eyes:  General: No scleral icterus.       Right eye: No discharge.        Left eye: No discharge.     Extraocular Movements: Extraocular movements intact.     Conjunctiva/sclera: Conjunctivae normal.     Pupils: Pupils are equal, round, and reactive to light.  Cardiovascular:     Rate and Rhythm: Normal rate and regular rhythm.     Pulses: Normal pulses.     Heart sounds: Normal heart sounds. No murmur heard.   No friction rub. No gallop.  Pulmonary:     Effort: Pulmonary effort is normal. No respiratory distress.     Breath sounds: Normal breath sounds. No stridor. No wheezing, rhonchi or rales.  Chest:     Chest wall: No tenderness.  Abdominal:     General: Abdomen is flat. Bowel sounds are normal. There is no distension.     Palpations: Abdomen is soft. There is no mass.     Tenderness: There is no abdominal tenderness. There is no right CVA tenderness, left CVA tenderness, guarding or rebound.     Hernia: No hernia is present.  Musculoskeletal:        General: No swelling, tenderness or deformity. Normal range of motion.     Cervical back: Normal range of motion and neck supple. No rigidity or tenderness.      Right lower leg: No edema.     Left lower leg: No edema.  Lymphadenopathy:     Cervical: No cervical adenopathy.  Skin:    General: Skin is warm and dry.     Coloration: Skin is not jaundiced or pale.     Findings: No bruising, erythema, lesion or rash.  Neurological:     General: No focal deficit present.     Mental Status: She is alert and oriented to person, place, and time. Mental status is at baseline.     Cranial Nerves: No cranial nerve deficit.     Sensory: No sensory deficit.     Motor: No weakness.     Coordination: Coordination normal.     Gait: Gait normal.     Deep Tendon Reflexes: Reflexes normal.  Psychiatric:        Mood and Affect: Mood normal.        Behavior: Behavior normal.        Thought Content: Thought content normal.        Judgment: Judgment normal.    Breasts: breasts appear normal, symmetrical, no tenderness on exam, no suspicious masses, no skin or nipple changes or axillary nodes. GYN:  External genitalia within normal limits, normal hair distribution, no lesions. Urethral meatus normal, no lesions. Vaginal mucosa pink, moist, normal rugae, no lesions. No cystocele or rectocele. cervix without lesions, no discharge. Bimanual exam revealed normal uterus.  No bladder/suprapubic fullness, masses or tenderness. No cervical motion tenderness. No adnexal fullness. Anus and perineum within normal limits, no lesions.  No results found.  Assessment/plan: JANAT TABBERT is a 56 y.o. female present for CPE/[facility] Essential hypertension/obesity Stable.  Continue lisinopril 40 mg qd Continue amlodipine to 10 mg qd Low sodium diet - routine exercise.  Cbc, cmp, tsh, lipids collected today F/u 5.5 mos  Tobacco abuse/estrogen def Encouraged smoking cessation DEXA ordered for august 2023  Diabetes mellitus screening - Hemoglobin A1c Cervical cancer screening - Cytology - PAP( [facility]) Cervical cancer screening - Cytology - PAP( [place])> w/  co-test, rpt q 5 yr if normal.  Estrogen deficiency -  DG Bone Density; Future Breast cancer screening by mammogram - MM 3D SCREEN BREAST BILATERAL; Future Encounter for herpes zoster vaccination - Varicella-zoster vaccine IM Shingrix #2 can be given in 3 mos by nurse visit or at her [facility] appt in 5.5 mos.   Well female exam with routine gynecological exam Colonoscopy: no fhx. cologuard completed 02/2020.Due 2024 Mammogram: completed: 08/13/2021 BCGSO>> ordered today for 2023 Cervical cancer screening: last pap: 2017, results: NL/NEG HPV, completed by: Claiborne Billings- rpt 5 yrs. [id number]> completed today Immunizations: tdap UTD 2016, Influenza declined(encouraged yearly), PNA23 completed- could consider PNA20 next year.  shingrix series started today (nurse visit 3 mos for #2). covid declined Infectious disease screening: HIV declined today. Hep c declined today DEXA: estrogen def, smoker> screening ordered today for aug 2023 Patient was encouraged to exercise greater than 150 minutes a week. Patient was encouraged to choose a diet filled with fresh fruits and vegetables, and lean meats. AVS provided to patient today for education/recommendation on gender specific health and safety maintenance. Return in about 24 weeks (around 06/21/2022) for [facility] (30 min).  Orders Placed This Encounter  Procedures   MM 3D SCREEN BREAST BILATERAL   DG Bone Density   Varicella-zoster vaccine IM   CBC   Comprehensive metabolic panel   Hemoglobin A1c   Lipid panel   TSH    Orders Placed This Encounter  Procedures   MM 3D SCREEN BREAST BILATERAL   DG Bone Density   Varicella-zoster vaccine IM   CBC   Comprehensive metabolic panel   Hemoglobin A1c   Lipid panel   TSH   Meds ordered this encounter  Medications   amLODipine (NORVASC) 10 MG tablet    Sig: Take 1 tablet (10 mg total) by mouth daily.    Dispense:  90 tablet    Refill:  1   lisinopril (ZESTRIL) 40 MG tablet    Sig: Take 1 tablet (40 mg total)  by mouth daily.    Dispense:  90 tablet    Refill:  1   Referral Orders  No referral(s) requested today     Electronically signed by: Felix Pacini, DO [facility]

## 2022-01-04 NOTE — Patient Instructions (Addendum)
Great to see you today.  I have refilled the medication(s) we provide.  Please schedule your next appt at end of June for your chronic conditions.   If labs were collected, we will inform you of lab results once received either by echart message or telephone call.   - echart message- for normal results that have been seen by the patient already.   - telephone call: abnormal results or if patient has not viewed results in their echart.  Health Maintenance, Female Adopting a healthy lifestyle and getting preventive care are important in promoting health and wellness. Ask your health care provider about: The right schedule for you to have regular tests and exams. Things you can do on your own to prevent diseases and keep yourself healthy. What should I know about diet, weight, and exercise? Eat a healthy diet  Eat a diet that includes plenty of vegetables, fruits, low-fat dairy products, and lean protein. Do not eat a lot of foods that are high in solid fats, added sugars, or sodium. Maintain a healthy weight Body mass index (BMI) is used to identify weight problems. It estimates body fat based on height and weight. Your health care provider can help determine your BMI and help you achieve or maintain a healthy weight. Get regular exercise Get regular exercise. This is one of the most important things you can do for your health. Most adults should: Exercise for at least 150 minutes each week. The exercise should increase your heart rate and make you sweat (moderate-intensity exercise). Do strengthening exercises at least twice a week. This is in addition to the moderate-intensity exercise. Spend less time sitting. Even light physical activity can be beneficial. Watch cholesterol and blood lipids Have your blood tested for lipids and cholesterol at 56 years of age, then have this test every 5 years. Have your cholesterol levels checked more often if: Your lipid or cholesterol levels are  high. You are older than 55 years of age. You are at high risk for heart disease. What should I know about cancer screening? Depending on your health history and family history, you may need to have cancer screening at various ages. This may include screening for: Breast cancer. Cervical cancer. Colorectal cancer. Skin cancer. Lung cancer. What should I know about heart disease, diabetes, and high blood pressure? Blood pressure and heart disease High blood pressure causes heart disease and increases the risk of stroke. This is more likely to develop in people who have high blood pressure readings or are overweight. Have your blood pressure checked: Every 3-5 years if you are 53-23 years of age. Every year if you are 39 years old or older. Diabetes Have regular diabetes screenings. This checks your fasting blood sugar level. Have the screening done: Once every three years after age 12 if you are at a normal weight and have a low risk for diabetes. More often and at a younger age if you are overweight or have a high risk for diabetes. What should I know about preventing infection? Hepatitis B If you have a higher risk for hepatitis B, you should be screened for this virus. Talk with your health care provider to find out if you are at risk for hepatitis B infection. Hepatitis C Testing is recommended for: Everyone born from [birth year] through 1964. Anyone with known risk factors for hepatitis C. Sexually transmitted infections (STIs) Get screened for STIs, including gonorrhea and chlamydia, if: You are sexually active and are younger than 55 years of  age. Bonita Quin are older than 55 years of age and your health care provider tells you that you are at risk for this type of infection. Your sexual activity has changed since you were last screened, and you are at increased risk for chlamydia or gonorrhea. Ask your health care provider if you are at risk. Ask your health care provider about whether you  are at high risk for HIV. Your health care provider may recommend a prescription medicine to help prevent HIV infection. If you choose to take medicine to prevent HIV, you should first get tested for HIV. You should then be tested every 3 months for as long as you are taking the medicine. Pregnancy If you are about to stop having your period (premenopausal) and you may become pregnant, seek counseling before you get pregnant. Take 400 to 800 micrograms (mcg) of folic acid every day if you become pregnant. Ask for birth control (contraception) if you want to prevent pregnancy. Osteoporosis and menopause Osteoporosis is a disease in which the bones lose minerals and strength with aging. This can result in bone fractures. If you are 28 years old or older, or if you are at risk for osteoporosis and fractures, ask your health care provider if you should: Be screened for bone loss. Take a calcium or vitamin D supplement to lower your risk of fractures. Be given hormone replacement therapy (HRT) to treat symptoms of menopause. Follow these instructions at home: Alcohol use Do not drink alcohol if: Your health care provider tells you not to drink. You are pregnant, may be pregnant, or are planning to become pregnant. If you drink alcohol: Limit how much you have to: 0-1 drink a day. Know how much alcohol is in your drink. In the [place] one drink equals one 12 oz bottle of beer (355 mL), one 5 oz glass of wine (148 mL), or one 1 oz glass of hard liquor (44 mL). Lifestyle Do not use any products that contain nicotine or tobacco. These products include cigarettes, chewing tobacco, and vaping devices, such as e-cigarettes. If you need help quitting, ask your health care provider. Do not use street drugs. Do not share needles. Ask your health care provider for help if you need support or information about quitting drugs. General instructions Schedule regular health, dental, and eye exams. Stay current  with your vaccines. Tell your health care provider if: You often feel depressed. You have ever been abused or do not feel safe at home. Summary Adopting a healthy lifestyle and getting preventive care are important in promoting health and wellness. Follow your health care provider's instructions about healthy diet, exercising, and getting tested or screened for diseases. Follow your health care provider's instructions on monitoring your cholesterol and blood pressure. This information is not intended to replace advice given to you by your health care provider. Make sure you discuss any questions you have with your health care provider. Document Revised: 05/03/2021 Document Reviewed: 05/03/2021 Elsevier Patient Education  2022 [employer].

## 2022-01-07 LAB — CYTOLOGY - PAP
Comment: NEGATIVE
Diagnosis: NEGATIVE
High risk HPV: NEGATIVE

## 2022-01-10 ENCOUNTER — Telehealth: Payer: Self-pay | Admitting: Family Medicine

## 2022-01-10 DIAGNOSIS — Z780 Asymptomatic menopausal state: Secondary | ICD-10-CM

## 2022-01-10 DIAGNOSIS — R87618 Other abnormal cytological findings on specimens from cervix uteri: Secondary | ICD-10-CM | POA: Insufficient documentation

## 2022-01-10 NOTE — Telephone Encounter (Signed)
Spoke with patient regarding results/recommendations.  

## 2022-01-10 NOTE — Telephone Encounter (Signed)
Please inform patient: Her Pap resulted with no signs of atypical cells suggesting cervical cancer.  The cotesting HPV is also negative.  There were cells called, endometrial cells that were present.  The cells are normal in a female that is still having menstrual cycles.  However she has been postmenopausal since 2017 by my records, therefore we do need to refer her to gynecology for further evaluation.   The endometrial cells are the cells that line the uterus and when she had become the menstrual cycle.  Anyone who is postmenopausal and having endometrial cells on Pap or history of reports of vaginal bleeding, need to be evaluated further to ensure this lining of her uterus is not abnormal.  They will call her to schedule.  I have placed the referral.

## 2022-02-14 ENCOUNTER — Ambulatory Visit: Payer: Self-pay | Admitting: Nurse Practitioner

## 2022-03-02 ENCOUNTER — Other Ambulatory Visit (HOSPITAL_COMMUNITY)
Admission: RE | Admit: 2022-03-02 | Discharge: 2022-03-02 | Disposition: A | Discharge status: Home / Self Care | Payer: 59 | Source: Ambulatory Visit | Attending: Nurse Practitioner | Admitting: Nurse Practitioner

## 2022-03-02 ENCOUNTER — Other Ambulatory Visit: Payer: Self-pay

## 2022-03-02 ENCOUNTER — Encounter: Payer: Self-pay | Admitting: Obstetrics and Gynecology

## 2022-03-02 ENCOUNTER — Ambulatory Visit: Payer: 59 | Admitting: Obstetrics and Gynecology

## 2022-03-02 VITALS — BP 110/72 | HR 77 | Ht 59.0 in | Wt 143.0 lb

## 2022-03-02 DIAGNOSIS — N882 Stricture and stenosis of cervix uteri: Secondary | ICD-10-CM | POA: Diagnosis not present

## 2022-03-02 DIAGNOSIS — R87618 Other abnormal cytological findings on specimens from cervix uteri: Secondary | ICD-10-CM | POA: Diagnosis not present

## 2022-03-02 DIAGNOSIS — N858 Other specified noninflammatory disorders of uterus: Secondary | ICD-10-CM | POA: Diagnosis not present

## 2022-03-02 NOTE — Patient Instructions (Signed)

## 2022-03-02 NOTE — Progress Notes (Signed)
56 y.o. No obstetric history on file. Single White or Caucasian Not Hispanic or Latino female here for a consultation from Dr Raoul Pitch for endometrial cells on cytology.  PMP, no bleeding.  ?She reports a lot of AUB when she was younger, bleed to the point of anemia. Treated with OCP's.  ?No current partner, has been sexually active, no pain, no STD concerns.  ?  ? ?Patient's last menstrual period was 03/15/2016.          ?Sexually active: Yes.    ?The current method of family planning is tubal ligation.    ?Exercising: Yes.     Walking  ?Smoker:  yes, !PPD ? ?Health Maintenance: ?Pap:  01/04/22 neg Hr HPV Neg Endometrial cells present  ?History of abnormal Pap:  yes in her 20's cryo  ?MMG:  08/13/21 density B BI-rads 1 neg  ?BMD:   ordered  ?Colonoscopy: cologuard 2021 Normal  ?TDaP:  10/20/15  ?Gardasil: n/a ? ? reports that she has been smoking cigarettes. She has a 30.00 pack-year smoking history. She has never used smokeless tobacco. She reports current alcohol use. She reports that she does not use drugs. ? ?Past Medical History:  ?Diagnosis Date  ? Anemia   ? Frequent headaches   ? History of blood transfusion   ? Hypertension   ? ? ?Past Surgical History:  ?Procedure Laterality Date  ? TUBAL LIGATION    ? ? ?Current Outpatient Medications  ?Medication Sig Dispense Refill  ? amLODipine (NORVASC) 10 MG tablet Take 1 tablet (10 mg total) by mouth daily. 90 tablet 1  ? cetirizine (ZYRTEC) 10 MG tablet Take 10 mg by mouth daily.    ? lisinopril (ZESTRIL) 40 MG tablet Take 1 tablet (40 mg total) by mouth daily. 90 tablet 1  ? ?No current facility-administered medications for this visit.  ? ? ?Family History  ?Problem Relation Age of Onset  ? Arthritis Mother   ? COPD Father   ? Cancer Neg Hx   ? Heart disease Neg Hx   ? ? ?Review of Systems  ?All other systems reviewed and are negative. ? ?Exam:   ?BP 110/72   Pulse 77   Ht 4\' 11"  (1.499 m)   Wt 143 lb (64.9 kg)   LMP 03/15/2016   SpO2 98%   BMI 28.88 kg/m?    Weight change: @WEIGHTCHANGE @ Height:   Height: 4\' 11"  (149.9 cm)  ?Ht Readings from Last 3 Encounters:  ?03/02/22 4\' 11"  (1.499 m)  ?01/04/22 4\' 10"  (1.473 m)  ?07/14/21 4\' 10"  (1.473 m)  ? ? ?General appearance: alert, cooperative and appears stated age ?Abdomen: soft, non-tender; non distended,  no masses,  no organomegaly ? ? ?Pelvic: External genitalia:  no lesions ?             Urethra:  normal appearing urethra with no masses, tenderness or lesions ?             Bartholins and Skenes: normal    ?             Vagina: normal appearing vagina with normal color and discharge, no lesions ?             Cervix: no lesions and stenotic ?              ?Bimanual Exam:  Uterus:  normal size, contour, position, consistency, mobility, non-tender and anteverted ?             Adnexa: no mass, fullness,  tenderness ?               ? ?The risks of endometrial biopsy were reviewed and a consent was obtained.  ?A speculum was placed in the vagina and the cervix was cleansed with betadine. A tenaculum was placed on the cervix. The cervix was stenotic and needed dilation. The cervix was dilated with the mini-dilators to the #3 hagar. The pipelle was then placed into the endometrial cavity. The uterus sounded to ~7 cm. The endometrial biopsy was performed, taking care to get a representative sample, sampling 360 degrees of the uterine cavity. Minimal tissue was obtained. The tenaculum and speculum were removed. There were no complications.  ? ?Gae Dry chaperoned for the exam. ? ?1. Unexplained endometrial cells on cervical cytology ?- Surgical pathology( [facility]/ [facility]) ? ?2. Cervical stenosis (uterine cervix) ?Needed cervical dilation ? ?CC: Dr Raoul Pitch ?Note sent ? ? ?

## 2022-03-04 LAB — SURGICAL PATHOLOGY

## 2022-03-08 NOTE — Telephone Encounter (Signed)
error 

## 2022-05-20 ENCOUNTER — Other Ambulatory Visit: Payer: Self-pay

## 2022-05-20 DIAGNOSIS — I10 Essential (primary) hypertension: Secondary | ICD-10-CM

## 2022-05-20 MED ORDER — AMLODIPINE BESYLATE 10 MG PO TABS: 10.0000 mg | ORAL_TABLET | Freq: Every day | ORAL | 0 refills | Status: DC

## 2022-05-24 ENCOUNTER — Other Ambulatory Visit: Payer: Self-pay

## 2022-05-24 DIAGNOSIS — I10 Essential (primary) hypertension: Secondary | ICD-10-CM

## 2022-05-24 MED ORDER — LISINOPRIL 40 MG PO TABS: 40.0000 mg | ORAL_TABLET | Freq: Every day | ORAL | 0 refills | Status: DC

## 2022-06-21 ENCOUNTER — Ambulatory Visit (INDEPENDENT_AMBULATORY_CARE_PROVIDER_SITE_OTHER): Payer: 59 | Admitting: Family Medicine

## 2022-06-21 ENCOUNTER — Encounter: Payer: Self-pay | Admitting: Family Medicine

## 2022-06-21 VITALS — BP 132/74 | HR 99 | Temp 98.0°F | Ht 59.0 in | Wt 141.0 lb

## 2022-06-21 DIAGNOSIS — D7589 Other specified diseases of blood and blood-forming organs: Secondary | ICD-10-CM

## 2022-06-21 DIAGNOSIS — Z720 Tobacco use: Secondary | ICD-10-CM

## 2022-06-21 DIAGNOSIS — E669 Obesity, unspecified: Secondary | ICD-10-CM

## 2022-06-21 DIAGNOSIS — I10 Essential (primary) hypertension: Secondary | ICD-10-CM | POA: Diagnosis not present

## 2022-06-21 DIAGNOSIS — Z23 Encounter for immunization: Secondary | ICD-10-CM

## 2022-06-21 MED ORDER — LISINOPRIL 40 MG PO TABS: 40.0000 mg | ORAL_TABLET | Freq: Every day | ORAL | 0 refills | Status: DC

## 2022-06-21 MED ORDER — BUPROPION HCL ER (SR) 150 MG PO TB12: 150.0000 mg | ORAL_TABLET | Freq: Two times a day (BID) | ORAL | 2 refills | Status: DC

## 2022-06-21 MED ORDER — AMLODIPINE BESYLATE 10 MG PO TABS: 10.0000 mg | ORAL_TABLET | Freq: Every day | ORAL | 1 refills | Status: DC

## 2022-06-21 MED ORDER — LISINOPRIL 40 MG PO TABS: 40.0000 mg | ORAL_TABLET | Freq: Every day | ORAL | 1 refills | Status: DC

## 2022-06-21 MED ORDER — AMLODIPINE BESYLATE 10 MG PO TABS: 10.0000 mg | ORAL_TABLET | Freq: Every day | ORAL | 0 refills | Status: DC

## 2022-06-21 NOTE — Progress Notes (Signed)
Patient ID: Carla Phillips, female  DOB: Apr 24, 1966, 56 y.o.   MRN: [id number] Patient Care Team    Relationship Specialty Notifications Start End  Natalia Leatherwood, DO PCP - General Family Medicine  10/20/15     Chief Complaint  Patient presents with   Hypertension    Cmc; pt is not fasting   Subjective: Carla Phillips is a 56 y.o.  Female  present for [facility]. All past medical history, surgical history, allergies, family history, immunizations, medications and social history were updated in the electronic medical record today. All recent labs, ED visits and hospitalizations within the last year were reviewed.  Hypertension/obesity: Pt reports compliance with Lisinopril 40 mg daily and amlodipine 10 mg QD. Patient denies chest pain, shortness of breath, dizziness or lower extremity edema.   Pt does not take a daily baby ASA. Pt is not prescribed statin. Patient had been on HCTZ 50 mg daily in the past. She never had good control of her blood pressure. She had frequent urination which bothered her.  Diet: Does not watch the sodium in her diet very closely. Exercise: Does not exercise routinely RF: Hypertension, obesity, smoker      11/05/2021   10:42 AM 11/30/2020    3:04 PM 08/20/2019    9:28 AM 02/27/2019    2:24 PM 08/29/2018    3:45 PM  Depression screen PHQ 2/9  Decreased Interest 0 0 0 0 0  Down, Depressed, Hopeless 0 0 0 0 0  PHQ - 2 Score 0 0 0 0 0  Altered sleeping 0      Tired, decreased energy 0      Change in appetite 0      Feeling bad or failure about yourself  0      Trouble concentrating 0      Moving slowly or fidgety/restless 0      Suicidal thoughts 0      PHQ-9 Score 0      Difficult doing work/chores Not difficult at all           No data to display          Immunization History  Administered Date(s) Administered   Pneumococcal Polysaccharide-23 08/20/2019   Tdap 10/20/2015   Zoster Recombinat (Shingrix) 01/04/2022, 06/21/2022   Past  Medical History:  Diagnosis Date   Anemia    Frequent headaches    History of blood transfusion    Hypertension    No Known Allergies Past Surgical History:  Procedure Laterality Date   TUBAL LIGATION     Family History  Problem Relation Age of Onset   Arthritis Mother    COPD Father    Cancer Neg Hx    Heart disease Neg Hx    Social History   Social History Narrative   Producer, television/film/video, 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.   Current every day smoker. Drinks 3 beers a day. Denies recreational drugs or caffeine use. Denies herbal remedies.   Wear seatbelt. Does not exercise routinely.   Smoke alarm present home.       Allergies as of 06/21/2022   No Known Allergies      Medication List        Accurate as of June 21, 2022  4:49 PM. If you have any questions, ask your nurse or doctor.          amLODipine 10  MG tablet Commonly known as: NORVASC Take 1 tablet (10 mg total) by mouth daily.   buPROPion 150 MG 12 hr tablet Commonly known as: Wellbutrin SR Take 1 tablet (150 mg total) by mouth 2 (two) times daily. Started by: Felix Pacini, DO   cetirizine 10 MG tablet Commonly known as: ZYRTEC Take 10 mg by mouth daily.   lisinopril 40 MG tablet Commonly known as: ZESTRIL Take 1 tablet (40 mg total) by mouth daily.        All past medical history, surgical history, allergies, family history, immunizations andmedications were updated in the EMR today and reviewed under the history and medication portions of their EMR.     No results found for this or any previous visit (from the past 2160 hour(s)).  MM 3D SCREEN BREAST BILATERAL  Result Date: 08/16/2021 CLINICAL DATA:  Screening. EXAM: DIGITAL SCREENING BILATERAL MAMMOGRAM WITH TOMOSYNTHESIS AND CAD TECHNIQUE: Bilateral screening digital craniocaudal and mediolateral oblique mammograms were obtained. Bilateral screening digital  breast tomosynthesis was performed. The images were evaluated with computer-aided detection. COMPARISON:  Previous exam(s). ACR Breast Density Category b: There are scattered areas of fibroglandular density. FINDINGS: There are no findings suspicious for malignancy. IMPRESSION: No mammographic evidence of malignancy. A result letter of this screening mammogram will be mailed directly to the patient. RECOMMENDATION: Screening mammogram in one year. (Code:SM-B-01Y) BI-RADS CATEGORY  1: Negative. Electronically Signed   By: Norva Pavlov M.D.   On: 08/16/2021 15:49     ROS 14 pt review of systems performed and negative (unless mentioned in an HPI)  Objective: BP 132/74   Pulse 99   Temp 98 F (36.7 C) (Oral)   Ht 4\' 11"  (1.499 m)   Wt 141 lb (64 kg)   LMP 03/15/2016   SpO2 97%   BMI 28.48 kg/m  Physical Exam Vitals and nursing note reviewed.  Constitutional:      General: She is not in acute distress.    Appearance: Normal appearance. She is not ill-appearing, toxic-appearing or diaphoretic.  HENT:     Head: Normocephalic and atraumatic.  Eyes:     General: No scleral icterus.       Right eye: No discharge.        Left eye: No discharge.     Extraocular Movements: Extraocular movements intact.     Conjunctiva/sclera: Conjunctivae normal.     Pupils: Pupils are equal, round, and reactive to light.  Cardiovascular:     Rate and Rhythm: Normal rate and regular rhythm.     Heart sounds: No murmur heard. Pulmonary:     Effort: Pulmonary effort is normal. No respiratory distress.     Breath sounds: Normal breath sounds. No wheezing, rhonchi or rales.  Musculoskeletal:     Cervical back: Neck supple. No tenderness.     Right lower leg: No edema.     Left lower leg: No edema.  Lymphadenopathy:     Cervical: No cervical adenopathy.  Skin:    General: Skin is warm and dry.     Coloration: Skin is not jaundiced or pale.     Findings: No erythema or rash.  Neurological:      Mental Status: She is alert and oriented to person, place, and time. Mental status is at baseline.     Motor: No weakness.     Gait: Gait normal.  Psychiatric:        Mood and Affect: Mood normal.        Behavior: Behavior  normal.        Thought Content: Thought content normal.        Judgment: Judgment normal.     No results found.  Assessment/plan: Carla Phillips is a 56 y.o. female present for CPE/cmc Essential hypertension/obesity stable continue lisinopril 40 mg qd Continue  amlodipine to 10 mg qd Low sodium diet - routine exercise.  Labs UTD F/u  Cpe/cmc  Tobacco abuse/estrogen def Encouraged smoking cessation- not ready to quit.  But she is considering it.  We discussed Zyban versus Chantix.  It looks like her insurance may cover Zyban better. Zyban 150 for 3 days, then Zyban 150 twice daily. Patient was encouraged to pick a quit date approximately 10-14 days after starting Zyban. Placed first nicotine patch on the night before quit date. Ensure to remove all tobacco products from the home the day prior to quit date. 3 months of Zyban prescribed for her.  If needing refills would be happy to place for her.  Encounter for herpes zoster vaccination Shingrix #2 administered today  Return in about 7 months (around 01/05/2023) for cpe (20 min), Routine chronic condition follow-up.  Orders Placed This Encounter  Procedures   Varicella-zoster vaccine IM    Orders Placed This Encounter  Procedures   Varicella-zoster vaccine IM   Meds ordered this encounter  Medications   DISCONTD: amLODipine (NORVASC) 10 MG tablet    Sig: Take 1 tablet (10 mg total) by mouth daily.    Dispense:  30 tablet    Refill:  0   DISCONTD: lisinopril (ZESTRIL) 40 MG tablet    Sig: Take 1 tablet (40 mg total) by mouth daily.    Dispense:  30 tablet    Refill:  0    Needs OV for refills   amLODipine (NORVASC) 10 MG tablet    Sig: Take 1 tablet (10 mg total) by mouth daily.     Dispense:  90 tablet    Refill:  1    Dc 30 d please   lisinopril (ZESTRIL) 40 MG tablet    Sig: Take 1 tablet (40 mg total) by mouth daily.    Dispense:  90 tablet    Refill:  1    Dc 30 d please   buPROPion (WELLBUTRIN SR) 150 MG 12 hr tablet    Sig: Take 1 tablet (150 mg total) by mouth 2 (two) times daily.    Dispense:  60 tablet    Refill:  2   Referral Orders  No referral(s) requested today     Electronically signed by: Felix Pacini, DO [facility] [place]

## 2022-07-07 ENCOUNTER — Other Ambulatory Visit: Payer: Self-pay

## 2022-07-07 MED ORDER — BUPROPION HCL ER (SR) 150 MG PO TB12: 150.0000 mg | ORAL_TABLET | Freq: Two times a day (BID) | ORAL | 0 refills | Status: DC

## 2022-07-26 ENCOUNTER — Other Ambulatory Visit: Payer: Self-pay | Admitting: Family Medicine

## 2022-07-26 DIAGNOSIS — Z1231 Encounter for screening mammogram for malignant neoplasm of breast: Secondary | ICD-10-CM

## 2022-08-16 ENCOUNTER — Ambulatory Visit: Payer: 59

## 2022-08-26 ENCOUNTER — Ambulatory Visit: Payer: 59

## 2022-09-06 ENCOUNTER — Ambulatory Visit
Admission: RE | Admit: 2022-09-06 | Discharge: 2022-09-06 | Disposition: A | Discharge status: Home / Self Care | Payer: 59 | Source: Ambulatory Visit | Attending: Family Medicine | Admitting: Family Medicine

## 2022-09-06 DIAGNOSIS — Z1231 Encounter for screening mammogram for malignant neoplasm of breast: Secondary | ICD-10-CM | POA: Diagnosis not present

## 2023-01-05 ENCOUNTER — Encounter: Payer: 59 | Admitting: Family Medicine

## 2023-01-09 ENCOUNTER — Encounter: Payer: Self-pay | Admitting: Family Medicine

## 2023-01-09 ENCOUNTER — Other Ambulatory Visit: Payer: Self-pay

## 2023-01-09 ENCOUNTER — Ambulatory Visit (INDEPENDENT_AMBULATORY_CARE_PROVIDER_SITE_OTHER): Payer: 59 | Admitting: Family Medicine

## 2023-01-09 VITALS — BP 122/74 | HR 112 | Temp 98.4°F | Ht 59.0 in | Wt 142.0 lb

## 2023-01-09 DIAGNOSIS — I10 Essential (primary) hypertension: Secondary | ICD-10-CM

## 2023-01-09 DIAGNOSIS — Z1231 Encounter for screening mammogram for malignant neoplasm of breast: Secondary | ICD-10-CM | POA: Diagnosis not present

## 2023-01-09 DIAGNOSIS — Z1211 Encounter for screening for malignant neoplasm of colon: Secondary | ICD-10-CM

## 2023-01-09 DIAGNOSIS — Z720 Tobacco use: Secondary | ICD-10-CM | POA: Diagnosis not present

## 2023-01-09 DIAGNOSIS — E2839 Other primary ovarian failure: Secondary | ICD-10-CM

## 2023-01-09 DIAGNOSIS — Z122 Encounter for screening for malignant neoplasm of respiratory organs: Secondary | ICD-10-CM

## 2023-01-09 DIAGNOSIS — Z23 Encounter for immunization: Secondary | ICD-10-CM

## 2023-01-09 DIAGNOSIS — Z0000 Encounter for general adult medical examination without abnormal findings: Secondary | ICD-10-CM | POA: Diagnosis not present

## 2023-01-09 DIAGNOSIS — F17210 Nicotine dependence, cigarettes, uncomplicated: Secondary | ICD-10-CM

## 2023-01-09 MED ORDER — AMLODIPINE BESYLATE 10 MG PO TABS: 10.0000 mg | ORAL_TABLET | Freq: Every day | ORAL | 1 refills | Status: DC

## 2023-01-09 MED ORDER — LISINOPRIL 40 MG PO TABS: 40.0000 mg | ORAL_TABLET | Freq: Every day | ORAL | 1 refills | Status: DC

## 2023-01-09 MED ORDER — BUPROPION HCL ER (SR) 150 MG PO TB12: 150.0000 mg | ORAL_TABLET | Freq: Two times a day (BID) | ORAL | 1 refills | Status: DC

## 2023-01-09 NOTE — Progress Notes (Signed)
Patient ID: Carla Phillips, female  DOB: January 11, 1966, 57 y.o.   MRN: [id number] Patient Care Team    Relationship Specialty Notifications Start End  Natalia Leatherwood, DO PCP - General Family Medicine  10/20/15     Chief Complaint  Patient presents with   Annual Exam    Pt is not fasting     Subjective: Carla Phillips is a 57 y.o.  Female  present for CPE and Chronic Conditions/illness Management All past medical history, surgical history, allergies, family history, immunizations, medications and social history were updated in the electronic medical record today. All recent labs, ED visits and hospitalizations within the last year were reviewed.  Hypertension/obesity: Pt reports compliance with Lisinopril 40 mg daily and amlodipine 10 mg QD.  Patient denies chest pain, shortness of breath, dizziness or lower extremity edema.  Pt does not take a daily baby ASA. Pt is not prescribed statin. Patient had been on HCTZ 50 mg daily in the past. She never had good control of her blood pressure. She had frequent urination which bothered her.  Diet: Does not watch the sodium in her diet very closely. Exercise: Does not exercise routinely RF: Hypertension, obesity, smoker   Health maintenance:  Colonoscopy: no fhx. cologuard completed 02/2020.Due 02/2023> ordered Mammogram: completed: 09/06/2022 BCGSO>> ordered today for 2024 Cervical cancer screening: last pap: 2023, results: NL/NEG HPV, completed by: Claiborne Billings- rpt 5 yrs. Immunizations: tdap UTD 2016, Influenza declined(encouraged yearly), PNA23 completed. shingrix series completed  Infectious disease screening: HIV declined today. Hep c declined  DEXA: estrogen def, smoker> screening ordered last year, not completed. Assistive device: none Oxygen [id number] Patient has a Dental home. Hospitalizations/ED visits: reviewed  Shared decision making visit for lung cancer screening: Patient was brought in today for a office visit  concerning shared decision making for their lung cancer screening.Carla Phillips is a 57 y.o. female Patient is between the ages of 53-80: Yes Patient is a current smoker with at least 20 year pack year history or Patient is a former smoker, quit less than 15 years ago and has a 20 pack year history : Yes Patient has current symptoms: No Patient has a  health problem that substantially limits life expectancy or the ability or willingness to have curative lung surgery: No      01/09/2023    1:57 PM 11/05/2021   10:42 AM 11/30/2020    3:04 PM 08/20/2019    9:28 AM 02/27/2019    2:24 PM  Depression screen PHQ 2/9  Decreased Interest 0 0 0 0 0  Down, Depressed, Hopeless 0 0 0 0 0  PHQ - 2 Score 0 0 0 0 0  Altered sleeping 0 0     Tired, decreased energy 0 0     Change in appetite 0 0     Feeling bad or failure about yourself  0 0     Trouble concentrating 0 0     Moving slowly or fidgety/restless 0 0     Suicidal thoughts 0 0     PHQ-9 Score 0 0     Difficult doing work/chores  Not difficult at all          No data to display           Immunization History  Administered Date(s) Administered   Pneumococcal Polysaccharide-23 08/20/2019   Tdap 10/20/2015   Zoster Recombinat (Shingrix) 01/04/2022, 06/21/2022    Past Medical History:  Diagnosis Date   Anemia  Frequent headaches    History of blood transfusion    Hypertension    No Known Allergies Past Surgical History:  Procedure Laterality Date   TUBAL LIGATION     Family History  Problem Relation Age of Onset   Arthritis Mother    COPD Father    Cancer Neg Hx    Heart disease Neg Hx    Social History   Social History Narrative   Producer, television/film/video, 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.   Current every day smoker. Drinks 3 beers a day. Denies recreational drugs or caffeine use. Denies herbal remedies.   Wear seatbelt. Does not exercise  routinely.   Smoke alarm present home.       Allergies as of 01/09/2023   No Known Allergies      Medication List        Accurate as of January 09, 2023  2:29 PM. If you have any questions, ask your nurse or doctor.          amLODipine 10 MG tablet Commonly known as: NORVASC Take 1 tablet (10 mg total) by mouth daily.   buPROPion 150 MG 12 hr tablet Commonly known as: Wellbutrin SR Take 1 tablet (150 mg total) by mouth 2 (two) times daily.   cetirizine 10 MG tablet Commonly known as: ZYRTEC Take 10 mg by mouth daily.   lisinopril 40 MG tablet Commonly known as: ZESTRIL Take 1 tablet (40 mg total) by mouth daily.        All past medical history, surgical history, allergies, family history, immunizations andmedications were updated in the EMR today and reviewed under the history and medication portions of their EMR.     No results found for this or any previous visit (from the past 2160 hour(s)).   ROS 14 pt review of systems performed and negative (unless mentioned in an HPI)  Objective: BP 122/74   Pulse (!) 112   Temp 98.4 F (36.9 C) (Oral)   Ht 4\' 11"  (1.499 m)   Wt 142 lb (64.4 kg)   LMP 03/15/2016   SpO2 97%   BMI 28.68 kg/m   Physical Exam Vitals and nursing note reviewed.  Constitutional:      General: She is not in acute distress.    Appearance: Normal appearance. She is not ill-appearing or toxic-appearing.  HENT:     Head: Normocephalic and atraumatic.     Right Ear: Tympanic membrane, ear canal and external ear normal. There is no impacted cerumen.     Left Ear: Tympanic membrane, ear canal and external ear normal. There is no impacted cerumen.     Nose: No congestion or rhinorrhea.     Mouth/Throat:     Mouth: Mucous membranes are moist.     Pharynx: Oropharynx is clear. No oropharyngeal exudate or posterior oropharyngeal erythema.  Eyes:     General: No scleral icterus.       Right eye: No discharge.        Left eye: No  discharge.     Extraocular Movements: Extraocular movements intact.     Conjunctiva/sclera: Conjunctivae normal.     Pupils: Pupils are equal, round, and reactive to light.  Cardiovascular:     Rate and Rhythm: Normal rate and regular rhythm.     Pulses: Normal pulses.     Heart sounds: Normal heart sounds. No murmur heard.    No friction rub.  No gallop.  Pulmonary:     Effort: Pulmonary effort is normal. No respiratory distress.     Breath sounds: Normal breath sounds. No stridor. No wheezing, rhonchi or rales.  Chest:     Chest wall: No tenderness.  Abdominal:     General: Abdomen is flat. Bowel sounds are normal. There is no distension.     Palpations: Abdomen is soft. There is no mass.     Tenderness: There is no abdominal tenderness. There is no right CVA tenderness, left CVA tenderness, guarding or rebound.     Hernia: No hernia is present.  Musculoskeletal:        General: No swelling, tenderness or deformity. Normal range of motion.     Cervical back: Normal range of motion and neck supple. No rigidity or tenderness.     Right lower leg: No edema.     Left lower leg: No edema.  Lymphadenopathy:     Cervical: No cervical adenopathy.  Skin:    General: Skin is warm and dry.     Coloration: Skin is not jaundiced or pale.     Findings: No bruising, erythema, lesion or rash.  Neurological:     General: No focal deficit present.     Mental Status: She is alert and oriented to person, place, and time. Mental status is at baseline.     Cranial Nerves: No cranial nerve deficit.     Sensory: No sensory deficit.     Motor: No weakness.     Coordination: Coordination normal.     Gait: Gait normal.     Deep Tendon Reflexes: Reflexes normal.  Psychiatric:        Mood and Affect: Mood normal.        Behavior: Behavior normal.        Thought Content: Thought content normal.        Judgment: Judgment normal.       No results found.  Assessment/plan: Carla Phillips is a  57 y.o. female present for CPE and Chronic Conditions/illness Management Essential hypertension/obesity Stable  continue lisinopril 40 mg qd Continue amlodipine to 10 mg qd Low sodium diet - routine exercise.  CBC, CMP, TSH, aqc and lipids collected today F/u 5.5 mos  Tobacco abuse/greater than 20-pack-year smoking history/current smoker/lung cancer screening  Encouraged smoking cessation -Patient was counseled on lung cancer screening today.  Patient does meet criteria for lung cancer screening.   Patient would NOT like to proceed with lung cancer screening.  Patient understands screening may warrant further studies or repeat studies if any abnormality is found.  Patient is agreeable. -Patient has had a recent kidney function test:Yes - CT CHEST LUNG CA SCREEN LOW DOSE W/O CM; Future - Follow-up upon screening results.   estrogen def DEXA ordered for august 2023 cancer screening - Cologuard Breast cancer screening by mammogram - MM 3D SCREEN BREAST BILATERAL; Future Influenza vaccine needed declined  Routine general medical examination at a health care facility Colonoscopy: no fhx. cologuard completed 02/2020.Due 02/2023> ordered Mammogram: completed: 09/06/2022 BCGSO>> ordered today for 2024 Cervical cancer screening: last pap: 2023, results: NL/NEG HPV, completed by: Raoul Pitch- rpt 5 yrs. Immunizations: tdap UTD 2016, Influenza declined(encouraged yearly), PNA23 completed. shingrix series completed  Infectious disease screening: HIV declined today. Hep c declined  DEXA: estrogen def, smoker> screening ordered last year, not comp> rerodered Patient was encouraged to exercise greater than 150 minutes a week. Patient was encouraged to choose a diet filled with fresh fruits and  vegetables, and lean meats. AVS provided to patient today for education/recommendation on gender specific health and safety maintenance.   Return in about 6 months (around 07/04/2023) for Routine chronic  condition follow-up.   Orders Placed This Encounter  Procedures   MM 3D SCREEN BREAST BILATERAL   DG Bone Density   CBC with Differential/Platelet   Comprehensive metabolic panel   Hemoglobin A1c   Lipid panel   TSH   Cologuard   Meds ordered this encounter  Medications   amLODipine (NORVASC) 10 MG tablet    Sig: Take 1 tablet (10 mg total) by mouth daily.    Dispense:  90 tablet    Refill:  1    Dc 30 d please   buPROPion (WELLBUTRIN SR) 150 MG 12 hr tablet    Sig: Take 1 tablet (150 mg total) by mouth 2 (two) times daily.    Dispense:  180 tablet    Refill:  1   lisinopril (ZESTRIL) 40 MG tablet    Sig: Take 1 tablet (40 mg total) by mouth daily.    Dispense:  90 tablet    Refill:  1    Dc 30 d please   Referral Orders  No referral(s) requested today     Electronically signed by: Howard Pouch, [place]

## 2023-01-09 NOTE — Patient Instructions (Signed)
No follow-ups on file.        Great to see you today.  I have refilled the medication(s) we provide.   If labs were collected, we will inform you of lab results once received either by echart message or telephone call.   - echart message- for normal results that have been seen by the patient already.   - telephone call: abnormal results or if patient has not viewed results in their echart.  Health Maintenance, Female Adopting a healthy lifestyle and getting preventive care are important in promoting health and wellness. Ask your health care provider about: The right schedule for you to have regular tests and exams. Things you can do on your own to prevent diseases and keep yourself healthy. What should I know about diet, weight, and exercise? Eat a healthy diet  Eat a diet that includes plenty of vegetables, fruits, low-fat dairy products, and lean protein. Do not eat a lot of foods that are high in solid fats, added sugars, or sodium. Maintain a healthy weight Body mass index (BMI) is used to identify weight problems. It estimates body fat based on height and weight. Your health care provider can help determine your BMI and help you achieve or maintain a healthy weight. Get regular exercise Get regular exercise. This is one of the most important things you can do for your health. Most adults should: Exercise for at least 150 minutes each week. The exercise should increase your heart rate and make you sweat (moderate-intensity exercise). Do strengthening exercises at least twice a week. This is in addition to the moderate-intensity exercise. Spend less time sitting. Even light physical activity can be beneficial. Watch cholesterol and blood lipids Have your blood tested for lipids and cholesterol at 56 years of age, then have this test every 5 years. Have your cholesterol levels checked more often if: Your lipid or cholesterol levels are high. You are older than 56 years of  age. You are at high risk for heart disease. What should I know about cancer screening? Depending on your health history and family history, you may need to have cancer screening at various ages. This may include screening for: Breast cancer. Cervical cancer. Colorectal cancer. Skin cancer. Lung cancer. What should I know about heart disease, diabetes, and high blood pressure? Blood pressure and heart disease High blood pressure causes heart disease and increases the risk of stroke. This is more likely to develop in people who have high blood pressure readings or are overweight. Have your blood pressure checked: Every 3-5 years if you are 18-39 years of age. Every year if you are 40 years old or older. Diabetes Have regular diabetes screenings. This checks your fasting blood sugar level. Have the screening done: Once every three years after age 40 if you are at a normal weight and have a low risk for diabetes. More often and at a younger age if you are overweight or have a high risk for diabetes. What should I know about preventing infection? Hepatitis B If you have a higher risk for hepatitis B, you should be screened for this virus. Talk with your health care provider to find out if you are at risk for hepatitis B infection. Hepatitis C Testing is recommended for: Everyone born from 1945 through 1965. Anyone with known risk factors for hepatitis C. Sexually transmitted infections (STIs) Get screened for STIs, including gonorrhea and chlamydia, if: You are sexually active and are younger than 56 years of age. You are   older than 56 years of age and your health care provider tells you that you are at risk for this type of infection. Your sexual activity has changed since you were last screened, and you are at increased risk for chlamydia or gonorrhea. Ask your health care provider if you are at risk. Ask your health care provider about whether you are at high risk for HIV. Your health  care provider may recommend a prescription medicine to help prevent HIV infection. If you choose to take medicine to prevent HIV, you should first get tested for HIV. You should then be tested every 3 months for as long as you are taking the medicine. Pregnancy If you are about to stop having your period (premenopausal) and you may become pregnant, seek counseling before you get pregnant. Take 400 to 800 micrograms (mcg) of folic acid every day if you become pregnant. Ask for birth control (contraception) if you want to prevent pregnancy. Osteoporosis and menopause Osteoporosis is a disease in which the bones lose minerals and strength with aging. This can result in bone fractures. If you are 65 years old or older, or if you are at risk for osteoporosis and fractures, ask your health care provider if you should: Be screened for bone loss. Take a calcium or vitamin D supplement to lower your risk of fractures. Be given hormone replacement therapy (HRT) to treat symptoms of menopause. Follow these instructions at home: Alcohol use Do not drink alcohol if: Your health care provider tells you not to drink. You are pregnant, may be pregnant, or are planning to become pregnant. If you drink alcohol: Limit how much you have to: 0-1 drink a day. Know how much alcohol is in your drink. In the [place] one drink equals one 12 oz bottle of beer (355 mL), one 5 oz glass of wine (148 mL), or one 1 oz glass of hard liquor (44 mL). Lifestyle Do not use any products that contain nicotine or tobacco. These products include cigarettes, chewing tobacco, and vaping devices, such as e-cigarettes. If you need help quitting, ask your health care provider. Do not use street drugs. Do not share needles. Ask your health care provider for help if you need support or information about quitting drugs. General instructions Schedule regular health, dental, and eye exams. Stay current with your vaccines. Tell your health  care provider if: You often feel depressed. You have ever been abused or do not feel safe at home. Summary Adopting a healthy lifestyle and getting preventive care are important in promoting health and wellness. Follow your health care provider's instructions about healthy diet, exercising, and getting tested or screened for diseases. Follow your health care provider's instructions on monitoring your cholesterol and blood pressure. This information is not intended to replace advice given to you by your health care provider. Make sure you discuss any questions you have with your health care provider. Document Revised: 05/03/2021 Document Reviewed: 05/03/2021 Elsevier Patient Education  2023 [employer]  

## 2023-01-10 LAB — LIPID PANEL
Cholesterol: 209 mg/dL — ABNORMAL HIGH (ref <200)
HDL: 102 mg/dL (ref >=50)
LDL Cholesterol (Calc): 88 mg/dL (calc)
Non-HDL Cholesterol (Calc): 107 mg/dL (calc) (ref <130)
Total CHOL/HDL Ratio: 2 (calc) (ref <5.0)
Triglycerides: 91 mg/dL (ref <150)

## 2023-01-10 LAB — CBC WITH DIFFERENTIAL/PLATELET
Absolute Monocytes: 778 {cells}/uL (ref 200–950)
Basophils Absolute: 51 {cells}/uL (ref 0–200)
Basophils Relative: 0.5 %
Eosinophils Absolute: 61 {cells}/uL (ref 15–500)
Eosinophils Relative: 0.6 %
HCT: 42.5 % (ref 35.0–45.0)
Hemoglobin: 14.8 g/dL (ref 11.7–15.5)
Lymphs Abs: 2333 {cells}/uL (ref 850–3900)
MCH: 34.3 pg — ABNORMAL HIGH (ref 27.0–33.0)
MCHC: 34.8 g/dL (ref 32.0–36.0)
MCV: 98.6 fL (ref 80.0–100.0)
MPV: 9 fL (ref 7.5–12.5)
Monocytes Relative: 7.7 %
Neutro Abs: 6878 {cells}/uL (ref 1500–7800)
Neutrophils Relative %: 68.1 %
Platelets: 308 Thousand/uL (ref 140–400)
RBC: 4.31 Million/uL (ref 3.80–5.10)
RDW: 11.7 % (ref 11.0–15.0)
Total Lymphocyte: 23.1 %
WBC: 10.1 Thousand/uL (ref 3.8–10.8)

## 2023-01-10 LAB — COMPREHENSIVE METABOLIC PANEL WITH GFR
AG Ratio: 2 (calc) (ref 1.0–2.5)
ALT: 13 U/L (ref 6–29)
AST: 15 U/L (ref 10–35)
Albumin: 4.4 g/dL (ref 3.6–5.1)
Alkaline phosphatase (APISO): 80 U/L (ref 37–153)
BUN: 10 mg/dL (ref 7–25)
CO2: 23 mmol/L (ref 20–32)
Calcium: 9.6 mg/dL (ref 8.6–10.4)
Chloride: 102 mmol/L (ref 98–110)
Creat: 0.79 mg/dL (ref 0.50–1.03)
Globulin: 2.2 g/dL (ref 1.9–3.7)
Glucose, Bld: 135 mg/dL — ABNORMAL HIGH (ref 65–99)
Potassium: 4.4 mmol/L (ref 3.5–5.3)
Sodium: 137 mmol/L (ref 135–146)
Total Bilirubin: 0.3 mg/dL (ref 0.2–1.2)
Total Protein: 6.6 g/dL (ref 6.1–8.1)

## 2023-01-10 LAB — HEMOGLOBIN A1C
Hgb A1c MFr Bld: 5.1 % of total Hgb (ref <5.7)
Mean Plasma Glucose: 100 mg/dL
eAG (mmol/L): 5.5 mmol/L

## 2023-01-10 LAB — TSH: TSH: 0.88 mIU/L

## 2023-02-15 ENCOUNTER — Telehealth: Payer: Self-pay

## 2023-02-15 NOTE — Telephone Encounter (Signed)
Spoke with pt and inform her of [employer] number to contact them regarding insurance info.

## 2023-04-21 LAB — COLOGUARD: COLOGUARD: NEGATIVE

## 2023-07-08 ENCOUNTER — Other Ambulatory Visit: Payer: Self-pay | Admitting: Family Medicine

## 2023-07-08 DIAGNOSIS — I10 Essential (primary) hypertension: Secondary | ICD-10-CM

## 2023-07-10 ENCOUNTER — Ambulatory Visit: Payer: 59 | Admitting: Family Medicine

## 2023-07-10 ENCOUNTER — Encounter: Payer: Self-pay | Admitting: Family Medicine

## 2023-07-10 VITALS — BP 131/78 | HR 88 | Temp 98.1°F | Wt 139.0 lb

## 2023-07-10 DIAGNOSIS — E669 Obesity, unspecified: Secondary | ICD-10-CM | POA: Diagnosis not present

## 2023-07-10 DIAGNOSIS — I10 Essential (primary) hypertension: Secondary | ICD-10-CM | POA: Diagnosis not present

## 2023-07-10 DIAGNOSIS — Z720 Tobacco use: Secondary | ICD-10-CM

## 2023-07-10 DIAGNOSIS — Z6828 Body mass index (BMI) 28.0-28.9, adult: Secondary | ICD-10-CM | POA: Diagnosis not present

## 2023-07-10 DIAGNOSIS — F17210 Nicotine dependence, cigarettes, uncomplicated: Secondary | ICD-10-CM

## 2023-07-10 MED ORDER — NICOTINE 14 MG/24HR TD PT24: 14.0000 mg | MEDICATED_PATCH | Freq: Every day | TRANSDERMAL | 0 refills | Status: AC

## 2023-07-10 MED ORDER — AMLODIPINE BESYLATE 10 MG PO TABS: 10.0000 mg | ORAL_TABLET | Freq: Every day | ORAL | 1 refills | Status: DC

## 2023-07-10 MED ORDER — BUPROPION HCL ER (SR) 150 MG PO TB12: 150.0000 mg | ORAL_TABLET | Freq: Two times a day (BID) | ORAL | 1 refills | Status: DC

## 2023-07-10 MED ORDER — NICOTINE 7 MG/24HR TD PT24: 7.0000 mg | MEDICATED_PATCH | Freq: Every day | TRANSDERMAL | 0 refills | Status: DC

## 2023-07-10 MED ORDER — LISINOPRIL 40 MG PO TABS: 40.0000 mg | ORAL_TABLET | Freq: Every day | ORAL | 1 refills | Status: DC

## 2023-07-10 NOTE — Patient Instructions (Addendum)
Return in about 6 months (around 01/11/2024) for cpe (20 min), Routine chronic condition follow-up.        Great to see you today.  I have refilled the medication(s) we provide.   If labs were collected, we will inform you of lab results once received either by echart message or telephone call.   - echart message- for normal results that have been seen by the patient already.   - telephone call: abnormal results or if patient has not viewed results in their echart.

## 2023-07-10 NOTE — Progress Notes (Signed)
Patient ID: Carla Phillips, female  DOB: 06-07-1966, 57 y.o.   MRN: [id number] Patient Care Team    Relationship Specialty Notifications Start End  Natalia Leatherwood, DO PCP - General Family Medicine  10/20/15     Chief Complaint  Patient presents with   Hypertension    Subjective: Carla Phillips is a 57 y.o.  Female  present forChronic Conditions/illness Management All past medical history, surgical history, allergies, family history, immunizations, medications and social history were updated in the electronic medical record today. All recent labs, ED visits and hospitalizations within the last year were reviewed.  Hypertension/obesity: Pt reports compliance  with Lisinopril 40 mg daily and amlodipine 10 mg QD.  Patient denies chest pain, shortness of breath, dizziness or lower extremity edema.  Pt does not take a daily baby ASA. Pt is not prescribed statin. Patient had been on HCTZ 50 mg daily in the past. She never had good control of her blood pressure. She had frequent urination which bothered her.  Diet: Does not watch the sodium in her diet very closely. Exercise: Does not exercise routinely RF: Hypertension, obesity, smoker     07/10/2023    2:47 PM 01/09/2023    1:57 PM 11/05/2021   10:42 AM 11/30/2020    3:04 PM 08/20/2019    9:28 AM  Depression screen PHQ 2/9  Decreased Interest 0 0 0 0 0  Down, Depressed, Hopeless 0 0 0 0 0  PHQ - 2 Score 0 0 0 0 0  Altered sleeping  0 0    Tired, decreased energy  0 0    Change in appetite  0 0    Feeling bad or failure about yourself   0 0    Trouble concentrating  0 0    Moving slowly or fidgety/restless  0 0    Suicidal thoughts  0 0    PHQ-9 Score  0 0    Difficult doing work/chores   Not difficult at all         No data to display           Immunization History  Administered Date(s) Administered   Pneumococcal Polysaccharide-23 08/20/2019   Tdap 10/20/2015   Zoster Recombinant(Shingrix) 01/04/2022,  06/21/2022    Past Medical History:  Diagnosis Date   Anemia    Frequent headaches    History of blood transfusion    Hypertension    No Known Allergies Past Surgical History:  Procedure Laterality Date   TUBAL LIGATION     Family History  Problem Relation Age of Onset   Arthritis Mother    COPD Father    Cancer Neg Hx    Heart disease Neg Hx    Social History   Social History Narrative   Producer, television/film/video, 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.   Current every day smoker. Drinks 3 beers a day. Denies recreational drugs or caffeine use. Denies herbal remedies.   Wear seatbelt. Does not exercise routinely.   Smoke alarm present home.       Allergies as of 07/10/2023   No Known Allergies      Medication List        Accurate as of July 10, 2023  2:59 PM. If you have any questions, ask your nurse or doctor.          amLODipine 10 MG tablet Commonly known as:  NORVASC Take 1 tablet (10 mg total) by mouth daily.   buPROPion 150 MG 12 hr tablet Commonly known as: Wellbutrin SR Take 1 tablet (150 mg total) by mouth 2 (two) times daily.   cetirizine 10 MG tablet Commonly known as: ZYRTEC Take 10 mg by mouth daily.   lisinopril 40 MG tablet Commonly known as: ZESTRIL Take 1 tablet (40 mg total) by mouth daily.   nicotine 14 mg/24hr patch Commonly known as: NICODERM CQ - dosed in mg/24 hours Place 1 patch (14 mg total) onto the skin daily for 14 days. Started by: Felix Pacini   nicotine 7 mg/24hr patch Commonly known as: NICODERM CQ - dosed in mg/24 hr Place 1 patch (7 mg total) onto the skin daily. Start taking on: July 21, 2023 Started by: Felix Pacini        All past medical history, surgical history, allergies, family history, immunizations andmedications were updated in the EMR today and reviewed under the history and medication portions of their EMR.     No results found  for this or any previous visit (from the past 2160 hour(s)).   ROS 14 pt review of systems performed and negative (unless mentioned in an HPI)  Objective: BP 131/78   Pulse 88   Temp 98.1 F (36.7 C)   Wt 139 lb (63 kg)   LMP 03/15/2016   SpO2 97%   BMI 28.07 kg/m  Physical Exam Vitals and nursing note reviewed.  Constitutional:      General: She is not in acute distress.    Appearance: Normal appearance. She is not ill-appearing, toxic-appearing or diaphoretic.  HENT:     Head: Normocephalic and atraumatic.  Eyes:     General: No scleral icterus.       Right eye: No discharge.        Left eye: No discharge.     Extraocular Movements: Extraocular movements intact.     Conjunctiva/sclera: Conjunctivae normal.     Pupils: Pupils are equal, round, and reactive to light.  Cardiovascular:     Rate and Rhythm: Normal rate and regular rhythm.  Pulmonary:     Effort: Pulmonary effort is normal. No respiratory distress.     Breath sounds: Normal breath sounds. No wheezing, rhonchi or rales.  Musculoskeletal:     Right lower leg: No edema.     Left lower leg: No edema.  Skin:    General: Skin is warm.     Findings: No rash.  Neurological:     Mental Status: She is alert and oriented to person, place, and time. Mental status is at baseline.     Motor: No weakness.     Gait: Gait normal.  Psychiatric:        Mood and Affect: Mood normal.        Behavior: Behavior normal.        Thought Content: Thought content normal.        Judgment: Judgment normal.      No results found.  Assessment/plan: Carla Phillips is a 57 y.o. female present forChronic Conditions/illness Management Essential hypertension/obesity Stable Continue  lisinopril 40 mg qd Continue  amlodipine to 10 mg qd Low sodium diet - routine exercise.  Labs due next visit F/u 5.5 mos cmc/cpe  Tobacco abuse/Obesity (BMI 30-39.9)/Smoking greater than 20 pack years She would like to retry cessation.   Restart Wellbutrin- quit date 10-14 days after start. Start nicotine patches (14 mg) night prior to quit date Discussed  tapering of patches and nicotine gum use F/u prn    Return in about 6 months (around 01/11/2024) for cpe (20 min), Routine chronic condition follow-up.   No orders of the defined types were placed in this encounter.  Meds ordered this encounter  Medications   amLODipine (NORVASC) 10 MG tablet    Sig: Take 1 tablet (10 mg total) by mouth daily.    Dispense:  90 tablet    Refill:  1   lisinopril (ZESTRIL) 40 MG tablet    Sig: Take 1 tablet (40 mg total) by mouth daily.    Dispense:  90 tablet    Refill:  1   nicotine (NICODERM CQ - DOSED IN MG/24 HOURS) 14 mg/24hr patch    Sig: Place 1 patch (14 mg total) onto the skin daily for 14 days.    Dispense:  14 patch    Refill:  0   buPROPion (WELLBUTRIN SR) 150 MG 12 hr tablet    Sig: Take 1 tablet (150 mg total) by mouth 2 (two) times daily.    Dispense:  180 tablet    Refill:  1   nicotine (NICODERM CQ - DOSED IN MG/24 HR) 7 mg/24hr patch    Sig: Place 1 patch (7 mg total) onto the skin daily.    Dispense:  28 patch    Refill:  0   Referral Orders  No referral(s) requested today     Electronically signed by: Felix Pacini, DO [facility] [place]

## 2023-09-07 ENCOUNTER — Ambulatory Visit (INDEPENDENT_AMBULATORY_CARE_PROVIDER_SITE_OTHER): Payer: 59

## 2023-09-07 ENCOUNTER — Telehealth: Payer: Self-pay | Admitting: Family Medicine

## 2023-09-07 ENCOUNTER — Ambulatory Visit: Payer: 59 | Admitting: Family Medicine

## 2023-09-07 VITALS — BP 117/80 | HR 97 | Temp 98.0°F | Wt 136.0 lb

## 2023-09-07 DIAGNOSIS — R051 Acute cough: Secondary | ICD-10-CM

## 2023-09-07 DIAGNOSIS — R0989 Other specified symptoms and signs involving the circulatory and respiratory systems: Secondary | ICD-10-CM

## 2023-09-07 DIAGNOSIS — J180 Bronchopneumonia, unspecified organism: Secondary | ICD-10-CM | POA: Diagnosis not present

## 2023-09-07 LAB — POCT INFLUENZA A/B
Influenza A, POC: NEGATIVE
Influenza B, POC: NEGATIVE

## 2023-09-07 LAB — POC COVID19 BINAXNOW: SARS Coronavirus 2 Ag: NEGATIVE

## 2023-09-07 MED ORDER — DOXYCYCLINE HYCLATE 100 MG PO TABS: 100.0000 mg | ORAL_TABLET | Freq: Two times a day (BID) | ORAL | 0 refills | Status: DC

## 2023-09-07 MED ORDER — METHYLPREDNISOLONE ACETATE 80 MG/ML IJ SUSP
80.0000 mg | Freq: Once | INTRAMUSCULAR | Status: AC
Start: 2023-09-07 — End: 2023-09-07
  Administered 2023-09-07: 80 mg via INTRAMUSCULAR

## 2023-09-07 MED ORDER — IPRATROPIUM-ALBUTEROL 0.5-2.5 (3) MG/3ML IN SOLN
3.0000 mL | RESPIRATORY_TRACT | Status: AC
Start: 2023-09-07 — End: 2023-09-07
  Administered 2023-09-07: 3 mL via RESPIRATORY_TRACT

## 2023-09-07 MED ORDER — PREDNISONE 20 MG PO TABS: 40.0000 mg | ORAL_TABLET | Freq: Every day | ORAL | 0 refills | Status: DC

## 2023-09-07 NOTE — Telephone Encounter (Signed)
Please call patient Her chest x-ray does not show evidence of pneumonia. She does have a lot of mucus plugging her airways.  Recommendations: Take the doxycycline twice daily x 10 days as prescribed Called in prednisone after reading the x-ray.  Since she had the injection today, she will start the prednisone pill tomorrow for an additional 4 days. Mucinex over-the-counter Please remind her to take deep cleansing breaths, this will help move out that mucous plugging.  An easy way to remember to do this, if watching TV every commercial break take 2 deep cleansing breaths.  Every time stand up or sit down etc. take 2 deep cleansing breaths.  Follow-up in 2 weeks if symptoms are not continuing to improve.  It is okay if cough sticks around a little longer, sometimes it can take up to 4 to 6 weeks for the cough to completely go away.

## 2023-09-07 NOTE — Telephone Encounter (Signed)
Results and recommendations given to pt. She addressed understanding

## 2023-09-07 NOTE — Patient Instructions (Signed)
[facility] [address] [place], [place] [address]   No follow-ups on file.        Great to see you today.  I have refilled the medication(s) we provide.   If labs were collected or images ordered, we will inform you of  results once we have received them and reviewed. We will contact you either by echart message, or telephone call.  Please give ample time to the testing facility, and our office to run,  receive and review results. Please do not call inquiring of results, even if you can see them in your chart. We will contact you as soon as we are able. If it has been over 1 week since the test was completed, and you have not yet heard from [place], then please call us.    - echart message- for normal results that have been seen by the patient already.   - telephone call: abnormal results or if patient has not viewed results in their echart.  If a referral to a specialist was entered for you, please call us in 2 weeks if you have not heard from the specialist office to schedule.

## 2023-09-07 NOTE — Progress Notes (Addendum)
Carla Phillips , 1966-05-29, 57 y.o., female MRN: [id number] Patient Care Team    Relationship Specialty Notifications Start End  Natalia Leatherwood, DO PCP - General Family Medicine  10/20/15     Chief Complaint  Patient presents with   Cough    Onset sat; cough, congestion     Subjective: Carla Phillips is a 57 y.o. Pt presents for an OV with complaints of cough of 6 days duration.  Associated symptoms include chills, production of cough, chest tightness and headache.  She has been 5 days without a cigarette and hopes to continue without after illness.  Pt has tried Mucinex and Robitussin to ease their symptoms.      07/10/2023    2:47 PM 01/09/2023    1:57 PM 11/05/2021   10:42 AM 11/30/2020    3:04 PM 08/20/2019    9:28 AM  Depression screen PHQ 2/9  Decreased Interest 0 0 0 0 0  Down, Depressed, Hopeless 0 0 0 0 0  PHQ - 2 Score 0 0 0 0 0  Altered sleeping  0 0    Tired, decreased energy  0 0    Change in appetite  0 0    Feeling bad or failure about yourself   0 0    Trouble concentrating  0 0    Moving slowly or fidgety/restless  0 0    Suicidal thoughts  0 0    PHQ-9 Score  0 0    Difficult doing work/chores   Not difficult at all      No Known Allergies Social History   Social History Narrative   Producer, television/film/video, 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.   Current every day smoker. Drinks 3 beers a day. Denies recreational drugs or caffeine use. Denies herbal remedies.   Wear seatbelt. Does not exercise routinely.   Smoke alarm present home.      Past Medical History:  Diagnosis Date   Anemia    Frequent headaches    History of blood transfusion    Hypertension    Past Surgical History:  Procedure Laterality Date   TUBAL LIGATION     Family History  Problem Relation Age of Onset   Arthritis Mother    COPD Father    Cancer Neg Hx    Heart disease Neg Hx     Allergies as of 09/07/2023   No Known Allergies      Medication List        Accurate as of September 07, 2023 12:40 PM. If you have any questions, ask your nurse or doctor.          amLODipine 10 MG tablet Commonly known as: NORVASC Take 1 tablet (10 mg total) by mouth daily.   buPROPion 150 MG 12 hr tablet Commonly known as: Wellbutrin SR Take 1 tablet (150 mg total) by mouth 2 (two) times daily.   cetirizine 10 MG tablet Commonly known as: ZYRTEC Take 10 mg by mouth daily.   doxycycline 100 MG tablet Commonly known as: VIBRA-TABS Take 1 tablet (100 mg total) by mouth 2 (two) times daily. Started by: Felix Pacini   lisinopril 40 MG tablet Commonly known as: ZESTRIL Take 1 tablet (40 mg total) by mouth daily.   nicotine 7 mg/24hr patch Commonly known as: NICODERM CQ - dosed in mg/24 hr Place 1 patch (7 mg total)  onto the skin daily.   predniSONE 20 MG tablet Commonly known as: DELTASONE Take 2 tablets (40 mg total) by mouth daily with breakfast. Start taking on: September 08, 2023 Started by: Felix Pacini        All past medical history, surgical history, allergies, family history, immunizations andmedications were updated in the EMR today and reviewed under the history and medication portions of their EMR.     Review of Systems  Constitutional:  Positive for chills and malaise/fatigue. Negative for fever.  HENT:  Positive for congestion and sinus pain.   Eyes:  Negative for discharge.  Respiratory:  Positive for cough, sputum production and wheezing. Negative for shortness of breath.   Gastrointestinal:  Negative for constipation, diarrhea, nausea and vomiting.  Musculoskeletal:  Negative for myalgias.  Neurological:  Positive for headaches. Negative for dizziness.   Negative, with the exception of above mentioned in HPI   Objective:  BP 117/80   Pulse 97   Temp 98 F (36.7 C)   Wt 136 lb (61.7 kg)   LMP 03/15/2016   SpO2 94%   BMI 27.47  kg/m  Body mass index is 27.47 kg/m. Physical Exam Vitals and nursing note reviewed.  Constitutional:      General: She is not in acute distress.    Appearance: Normal appearance. She is not ill-appearing, toxic-appearing or diaphoretic.  HENT:     Head: Normocephalic and atraumatic.     Right Ear: Tympanic membrane, ear canal and external ear normal.     Left Ear: Tympanic membrane and external ear normal.     Nose: Congestion present. No rhinorrhea.     Mouth/Throat:     Mouth: Mucous membranes are moist.     Pharynx: No oropharyngeal exudate or posterior oropharyngeal erythema.  Eyes:     General: No scleral icterus.       Right eye: No discharge.        Left eye: No discharge.     Extraocular Movements: Extraocular movements intact.     Conjunctiva/sclera: Conjunctivae normal.     Pupils: Pupils are equal, round, and reactive to light.  Cardiovascular:     Rate and Rhythm: Normal rate and regular rhythm.     Heart sounds: No murmur heard. Pulmonary:     Effort: Pulmonary effort is normal. No respiratory distress.     Breath sounds: Wheezing, rhonchi and rales present.  Musculoskeletal:     Cervical back: Neck supple.  Lymphadenopathy:     Cervical: No cervical adenopathy.  Skin:    General: Skin is warm.     Findings: No rash.  Neurological:     Mental Status: She is alert and oriented to person, place, and time. Mental status is at baseline.     Motor: No weakness.     Gait: Gait normal.  Psychiatric:        Mood and Affect: Mood normal.        Behavior: Behavior normal.        Thought Content: Thought content normal.        Judgment: Judgment normal.      No results found. DG Chest 2 View  Result Date: 09/07/2023 CLINICAL DATA:  Cough, concern for pneumonia EXAM: CHEST - 2 VIEW COMPARISON:  None Available. FINDINGS: Cardiac and mediastinal contours are within normal limits. No focal pulmonary opacity. No pleural effusion or pneumothorax. No acute osseous  abnormality. IMPRESSION: No acute cardiopulmonary process. Electronically Signed   By: Wiliam Ke  M.D.   On: 09/07/2023 11:49   Results for orders placed or performed in visit on 09/07/23 (from the past 24 hour(s))  POCT Influenza A/B     Status: None   Collection Time: 09/07/23  9:28 AM  Result Value Ref Range   Influenza A, POC Negative Negative   Influenza B, POC Negative Negative  POC COVID-19 BinaxNow     Status: None   Collection Time: 09/07/23  9:28 AM  Result Value Ref Range   SARS Coronavirus 2 Ag Negative Negative    Assessment/Plan: JENEEN COLAO is a 57 y.o. female present for OV for  Bronchial pneumonia/wheezing Rest, hydrate.  Chest x-ray ordered today  Duoneb tx provided in office> improved air movement. However, airways still with diffuse rhonchi.  Continue mucinex and OTC cough suppressant Doxycycline twice daily prescribed, take until completed.  IM Depo-Medrol 80 injection provided today Prednisone to start tomorrow 40 mg twice daily x 4 days If cough present it can last up to 6-8 weeks.  F/U 2 weeks if not improved.   Reviewed expectations re: course of current medical issues. Discussed self-management of symptoms. Outlined signs and symptoms indicating need for more acute intervention. Patient verbalized understanding and all questions were answered. Patient received an After-Visit Summary.    Orders Placed This Encounter  Procedures   DG Chest 2 View   POCT Influenza A/B   POC COVID-19 BinaxNow   Meds ordered this encounter  Medications   ipratropium-albuterol (DUONEB) 0.5-2.5 (3) MG/3ML nebulizer solution 3 mL   methylPREDNISolone acetate (DEPO-MEDROL) injection 80 mg   doxycycline (VIBRA-TABS) 100 MG tablet    Sig: Take 1 tablet (100 mg total) by mouth 2 (two) times daily.    Dispense:  20 tablet    Refill:  0   Referral Orders  No referral(s) requested today     Note is dictated utilizing voice recognition software. Although note  has been proof read prior to signing, occasional typographical errors still can be missed. If any questions arise, please do not hesitate to call for verification.   electronically signed by:  Felix Pacini, DO  [facility] [place]

## 2023-10-18 ENCOUNTER — Ambulatory Visit: Admission: RE | Admit: 2023-10-18 | Payer: 59 | Source: Ambulatory Visit

## 2024-01-06 ENCOUNTER — Other Ambulatory Visit: Payer: Self-pay | Admitting: Family Medicine

## 2024-01-06 DIAGNOSIS — I10 Essential (primary) hypertension: Secondary | ICD-10-CM

## 2024-01-25 ENCOUNTER — Encounter: Payer: Self-pay | Admitting: Family Medicine

## 2024-01-25 ENCOUNTER — Ambulatory Visit (INDEPENDENT_AMBULATORY_CARE_PROVIDER_SITE_OTHER): Payer: 59 | Admitting: Family Medicine

## 2024-01-25 VITALS — BP 122/78 | HR 90 | Temp 97.8°F | Ht 59.0 in | Wt 144.6 lb

## 2024-01-25 DIAGNOSIS — Z1231 Encounter for screening mammogram for malignant neoplasm of breast: Secondary | ICD-10-CM

## 2024-01-25 DIAGNOSIS — Z0000 Encounter for general adult medical examination without abnormal findings: Secondary | ICD-10-CM

## 2024-01-25 DIAGNOSIS — I10 Essential (primary) hypertension: Secondary | ICD-10-CM | POA: Diagnosis not present

## 2024-01-25 DIAGNOSIS — E2839 Other primary ovarian failure: Secondary | ICD-10-CM | POA: Diagnosis not present

## 2024-01-25 DIAGNOSIS — D7589 Other specified diseases of blood and blood-forming organs: Secondary | ICD-10-CM | POA: Diagnosis not present

## 2024-01-25 DIAGNOSIS — Z13220 Encounter for screening for lipoid disorders: Secondary | ICD-10-CM | POA: Diagnosis not present

## 2024-01-25 DIAGNOSIS — E669 Obesity, unspecified: Secondary | ICD-10-CM

## 2024-01-25 DIAGNOSIS — Z5320 Procedure and treatment not carried out because of patient's decision for unspecified reasons: Secondary | ICD-10-CM

## 2024-01-25 DIAGNOSIS — E663 Overweight: Secondary | ICD-10-CM

## 2024-01-25 DIAGNOSIS — Z131 Encounter for screening for diabetes mellitus: Secondary | ICD-10-CM

## 2024-01-25 DIAGNOSIS — Z780 Asymptomatic menopausal state: Secondary | ICD-10-CM

## 2024-01-25 DIAGNOSIS — Z23 Encounter for immunization: Secondary | ICD-10-CM

## 2024-01-25 DIAGNOSIS — Z87891 Personal history of nicotine dependence: Secondary | ICD-10-CM

## 2024-01-25 DIAGNOSIS — F17210 Nicotine dependence, cigarettes, uncomplicated: Secondary | ICD-10-CM

## 2024-01-25 LAB — LIPID PANEL
Cholesterol: 207 mg/dL — ABNORMAL HIGH (ref 0–200)
HDL: 121.8 mg/dL (ref >39.00)
LDL Cholesterol: 77 mg/dL (ref 0–99)
NonHDL: 85.59
Total CHOL/HDL Ratio: 2
Triglycerides: 45 mg/dL (ref 0.0–149.0)
VLDL: 9 mg/dL (ref 0.0–40.0)

## 2024-01-25 LAB — COMPREHENSIVE METABOLIC PANEL
ALT: 15 U/L (ref 0–35)
AST: 19 U/L (ref 0–37)
Albumin: 4.5 g/dL (ref 3.5–5.2)
Alkaline Phosphatase: 59 U/L (ref 39–117)
BUN: 12 mg/dL (ref 6–23)
CO2: 28 meq/L (ref 19–32)
Calcium: 9.5 mg/dL (ref 8.4–10.5)
Chloride: 100 meq/L (ref 96–112)
Creatinine, Ser: 0.73 mg/dL (ref 0.40–1.20)
GFR: 91.54 mL/min (ref >60.00)
Glucose, Bld: 73 mg/dL (ref 70–99)
Potassium: 4.4 meq/L (ref 3.5–5.1)
Sodium: 139 meq/L (ref 135–145)
Total Bilirubin: 0.5 mg/dL (ref 0.2–1.2)
Total Protein: 6.6 g/dL (ref 6.0–8.3)

## 2024-01-25 LAB — CBC
HCT: 40.9 % (ref 36.0–46.0)
Hemoglobin: 13.6 g/dL (ref 12.0–15.0)
MCHC: 33.2 g/dL (ref 30.0–36.0)
MCV: 102.6 fL — ABNORMAL HIGH (ref 78.0–100.0)
Platelets: 282 10*3/uL (ref 150.0–400.0)
RBC: 3.99 Mil/uL (ref 3.87–5.11)
RDW: 13.1 % (ref 11.5–15.5)
WBC: 7.1 10*3/uL (ref 4.0–10.5)

## 2024-01-25 LAB — HEMOGLOBIN A1C: Hgb A1c MFr Bld: 5.3 % (ref 4.6–6.5)

## 2024-01-25 LAB — TSH: TSH: 1.32 u[IU]/mL (ref 0.35–5.50)

## 2024-01-25 MED ORDER — CETIRIZINE HCL 10 MG PO TABS: 10.0000 mg | ORAL_TABLET | Freq: Every day | ORAL | 3 refills | Status: DC

## 2024-01-25 MED ORDER — LISINOPRIL 40 MG PO TABS: 40.0000 mg | ORAL_TABLET | Freq: Every day | ORAL | 0 refills | Status: DC

## 2024-01-25 MED ORDER — AMLODIPINE BESYLATE 10 MG PO TABS
10.0000 mg | ORAL_TABLET | Freq: Every day | ORAL | 0 refills | Status: DC
Start: 2024-01-25 — End: 2024-03-04

## 2024-01-25 NOTE — Progress Notes (Signed)
Patient ID: Carla Phillips, female  DOB: [birth year], 58 y.o.   MRN: [id number] Patient Care Team    Relationship Specialty Notifications Start End  Natalia Leatherwood, DO PCP - General Family Medicine  10/20/15     Chief Complaint  Patient presents with   Annual Exam    Pt is fasting    Subjective: Carla Phillips is a 58 y.o.  Female  present for CPE and Chronic Conditions/illness Management All past medical history, surgical history, allergies, family history, immunizations, medications and social history were updated in the electronic medical record today. All recent labs, ED visits and hospitalizations within the last year were reviewed.  Hypertension/obesity: Pt reports compliance with Lisinopril 40 mg daily and amlodipine 10 mg QD.  Patient denies chest pain, shortness of breath, dizziness or lower extremity edema.  Pt does not take a daily baby ASA. Pt is not prescribed statin. Patient had been on HCTZ 50 mg daily in the past. She never had good control of her blood pressure. She had frequent urination which bothered her.  Diet: Does not watch the sodium in her diet very closely. Exercise: Does not exercise routinely RF: Hypertension, obesity, smoker   Health maintenance:  Colonoscopy: no fhx. cologuard completed 02/2020.Due 02/2023> ordered Mammogram: completed: 09/06/2022 BCGSO>> ordered today  again , she did not have one completed last year Cervical cancer screening: last pap: 2023, results: NL/NEG HPV, completed by: Claiborne Billings- rpt 5 yrs. Immunizations: tdap UTD 2016, Influenza declined(encouraged yearly), PNA 20 declined- shingrix series completed  Infectious disease screening: HIV declined today. Hep c declined  DEXA: estrogen def, smoker> screening ordered last year, not completed x 2.  Ordered again for this year. Assistive device: none Oxygen WUJ:WJXB Patient has a Dental home. Hospitalizations/ED visits: reviewed       07/10/2023    2:47 PM 01/09/2023     1:57 PM 11/05/2021   10:42 AM 11/30/2020    3:04 PM 08/20/2019    9:28 AM  Depression screen PHQ 2/9  Decreased Interest 0 0 0 0 0  Down, Depressed, Hopeless 0 0 0 0 0  PHQ - 2 Score 0 0 0 0 0  Altered sleeping  0 0    Tired, decreased energy  0 0    Change in appetite  0 0    Feeling bad or failure about yourself   0 0    Trouble concentrating  0 0    Moving slowly or fidgety/restless  0 0    Suicidal thoughts  0 0    PHQ-9 Score  0 0    Difficult doing work/chores   Not difficult at all         No data to display           Immunization History  Administered Date(s) Administered   Pneumococcal Polysaccharide-23 08/20/2019   Tdap 10/20/2015   Zoster Recombinant(Shingrix) 01/04/2022, 06/21/2022    Past Medical History:  Diagnosis Date   Anemia    Frequent headaches    History of blood transfusion    Hypertension    No Known Allergies Past Surgical History:  Procedure Laterality Date   TUBAL LIGATION     Family History  Problem Relation Age of Onset   Arthritis Mother    COPD Father    Cancer Neg Hx    Heart disease Neg Hx    Social History   Social History Narrative   Producer, television/film/video, 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.   Current every day smoker. Drinks 3 beers a day. Denies recreational drugs or caffeine use. Denies herbal remedies.   Wear seatbelt. Does not exercise routinely.   Smoke alarm present home.       Allergies as of 01/25/2024   No Known Allergies      Medication List        Accurate as of January 25, 2024  9:25 AM. If you have any questions, ask your nurse or doctor.          STOP taking these medications    buPROPion 150 MG 12 hr tablet Commonly known as: Wellbutrin SR Stopped by: Felix Pacini   doxycycline 100 MG tablet Commonly known as: VIBRA-TABS Stopped by: Felix Pacini   nicotine 7 mg/24hr patch Commonly known as: NICODERM CQ - dosed in  mg/24 hr Stopped by: Felix Pacini   predniSONE 20 MG tablet Commonly known as: DELTASONE Stopped by: Felix Pacini       TAKE these medications    amLODipine 10 MG tablet Commonly known as: NORVASC Take 1 tablet (10 mg total) by mouth daily.   cetirizine 10 MG tablet Commonly known as: ZYRTEC Take 1 tablet (10 mg total) by mouth daily.   lisinopril 40 MG tablet Commonly known as: ZESTRIL Take 1 tablet (40 mg total) by mouth daily.        All past medical history, surgical history, allergies, family history, immunizations andmedications were updated in the EMR today and reviewed under the history and medication portions of their EMR.     No results found for this or any previous visit (from the past 2160 hours).   ROS 14 pt review of systems performed and negative (unless mentioned in an HPI)  Objective: BP 122/78   Pulse 90   Temp 97.8 F (36.6 C)   Ht 4\' 11"  (1.499 m)   Wt 144 lb 9.6 oz (65.6 kg)   LMP 03/15/2016   SpO2 98%   BMI 29.21 kg/m  Physical Exam Vitals and nursing note reviewed.  Constitutional:      General: She is not in acute distress.    Appearance: Normal appearance. She is not ill-appearing or toxic-appearing.  HENT:     Head: Normocephalic and atraumatic.     Right Ear: Tympanic membrane, ear canal and external ear normal. There is no impacted cerumen.     Left Ear: Tympanic membrane, ear canal and external ear normal. There is no impacted cerumen.     Nose: No congestion or rhinorrhea.     Mouth/Throat:     Mouth: Mucous membranes are moist.     Pharynx: Oropharynx is clear. No oropharyngeal exudate or posterior oropharyngeal erythema.  Eyes:     General: No scleral icterus.       Right eye: No discharge.        Left eye: No discharge.     Extraocular Movements: Extraocular movements intact.     Conjunctiva/sclera: Conjunctivae normal.     Pupils: Pupils are equal, round, and reactive to light.  Cardiovascular:     Rate and Rhythm:  Normal rate and regular rhythm.     Pulses: Normal pulses.     Heart sounds: Normal heart sounds. No murmur heard.    No friction rub. No gallop.  Pulmonary:     Effort: Pulmonary effort is normal. No respiratory distress.     Breath sounds: Normal breath sounds.  No stridor. No wheezing, rhonchi or rales.  Chest:     Chest wall: No tenderness.  Abdominal:     General: Abdomen is flat. Bowel sounds are normal. There is no distension.     Palpations: Abdomen is soft. There is no mass.     Tenderness: There is no abdominal tenderness. There is no right CVA tenderness, left CVA tenderness, guarding or rebound.     Hernia: No hernia is present.  Musculoskeletal:        General: No swelling, tenderness or deformity. Normal range of motion.     Cervical back: Normal range of motion and neck supple. No rigidity or tenderness.     Right lower leg: No edema.     Left lower leg: No edema.  Lymphadenopathy:     Cervical: No cervical adenopathy.  Skin:    General: Skin is warm and dry.     Coloration: Skin is not jaundiced or pale.     Findings: No bruising, erythema, lesion or rash.  Neurological:     General: No focal deficit present.     Mental Status: She is alert and oriented to person, place, and time. Mental status is at baseline.     Cranial Nerves: No cranial nerve deficit.     Sensory: No sensory deficit.     Motor: No weakness.     Coordination: Coordination normal.     Gait: Gait normal.     Deep Tendon Reflexes: Reflexes normal.  Psychiatric:        Mood and Affect: Mood normal.        Behavior: Behavior normal.        Thought Content: Thought content normal.        Judgment: Judgment normal.      No results found.  Assessment/plan: CHALYN AMESCUA is a 58 y.o. female present for CPE and Chronic Conditions/illness Management Essential hypertension/obesity Stable Continue lisinopril 40 mg qd Continue amlodipine to 10 mg qd Low sodium diet - routine exercise.   CBC, CMP, TSH, a1c and lipids collected today  Former smoker/greater than 20-pack-year smoking history/current smoker/lung cancer screening  STOP SMOKING 6 mos ago.  Declined lung cancer screen today  Routine general medical examination at a health care facility Patient was encouraged to exercise greater than 150 minutes a week. Patient was encouraged to choose a diet filled with fresh fruits and vegetables, and lean meats. AVS provided to patient today for education/recommendation on gender specific health and safety maintenance. Colonoscopy: no fhx. cologuard completed 02/2020.Due 02/2023> ordered Mammogram: completed: 09/06/2022 BCGSO>> ordered today  again , she did not have one completed last year Cervical cancer screening: last pap: 2023, results: NL/NEG HPV, completed by: Claiborne Billings- rpt 5 yrs. Immunizations: tdap UTD 2016, Influenza declined(encouraged yearly), PNA 20 declined- shingrix series completed  Infectious disease screening: HIV declined today. Hep c declined  DEXA: estrogen def, smoker> screening ordered last year, not completed x 2.  Ordered again for this year.   Return in about 24 weeks (around 07/11/2024) for Routine chronic condition follow-up.   Orders Placed This Encounter  Procedures   DG Bone Density   MM 3D SCREENING MAMMOGRAM BILATERAL BREAST   CBC   Comprehensive metabolic panel   Hemoglobin A1c   TSH   Lipid panel   Meds ordered this encounter  Medications   amLODipine (NORVASC) 10 MG tablet    Sig: Take 1 tablet (10 mg total) by mouth daily.    Dispense:  30 tablet  Refill:  0   lisinopril (ZESTRIL) 40 MG tablet    Sig: Take 1 tablet (40 mg total) by mouth daily.    Dispense:  30 tablet    Refill:  0   cetirizine (ZYRTEC) 10 MG tablet    Sig: Take 1 tablet (10 mg total) by mouth daily.    Dispense:  90 tablet    Refill:  3   Referral Orders  No referral(s) requested today     Electronically signed by: Felix Pacini, DO [facility] [place]

## 2024-01-25 NOTE — Patient Instructions (Addendum)

## 2024-03-02 ENCOUNTER — Other Ambulatory Visit: Payer: Self-pay | Admitting: Family Medicine

## 2024-03-02 DIAGNOSIS — I10 Essential (primary) hypertension: Secondary | ICD-10-CM

## 2024-03-04 NOTE — Telephone Encounter (Signed)
 Coricidin or plain Mucinex

## 2024-03-04 NOTE — Telephone Encounter (Signed)
 Please Advise

## 2024-03-04 NOTE — Telephone Encounter (Unsigned)
 Copied from CRM [id number]. Topic: Clinical - Medication Question >> Mar 04, 2024  2:14 PM Denese Killings wrote: Reason for CRM: Patient is having drainage from coughing. Patient wants to know what she can get over the counter that won't affect blood pressure to dry it up.

## 2024-06-03 ENCOUNTER — Encounter: Payer: Self-pay | Admitting: Family Medicine

## 2024-06-03 ENCOUNTER — Ambulatory Visit: Admitting: Family Medicine

## 2024-06-03 VITALS — BP 122/82 | HR 92 | Temp 98.2°F | Wt 143.0 lb

## 2024-06-03 DIAGNOSIS — B9689 Other specified bacterial agents as the cause of diseases classified elsewhere: Secondary | ICD-10-CM

## 2024-06-03 DIAGNOSIS — J329 Chronic sinusitis, unspecified: Secondary | ICD-10-CM | POA: Diagnosis not present

## 2024-06-03 MED ORDER — METHYLPREDNISOLONE ACETATE 80 MG/ML IJ SUSP
80.0000 mg | Freq: Once | INTRAMUSCULAR | Status: AC
Start: 2024-06-03 — End: 2024-06-03
  Administered 2024-06-03: 80 mg via INTRAMUSCULAR

## 2024-06-03 MED ORDER — DOXYCYCLINE HYCLATE 100 MG PO TABS: 100.0000 mg | ORAL_TABLET | Freq: Two times a day (BID) | ORAL | 0 refills | Status: DC

## 2024-06-03 NOTE — Patient Instructions (Signed)

## 2024-06-03 NOTE — Addendum Note (Signed)
 Addended by: Margaretta Shaw A on: 06/03/2024 01:32 PM   Modules accepted: Orders

## 2024-06-03 NOTE — Progress Notes (Signed)
 Carla Phillips , 10/21/1966, 58 y.o., female MRN: [id number] Patient Care Team    Relationship Specialty Notifications Start End  Mariel Shope, DO PCP - General Family Medicine  10/20/15     Chief Complaint  Patient presents with   Nasal Congestion    1 week; cough, congestion, sinus pressure. Pt has taken zyrtec  and nyquil.       Subjective: Carla Phillips is a 58 y.o. Pt presents for an OV with complaints of sinus pressure and pain of > 1 week duration.  Associated symptoms include felt feverish, fatigue, cough, headache (see ros). Pt has tried zyrtec , coricidin  and nyquil to ease their symptoms.       07/10/2023    2:47 PM 01/09/2023    1:57 PM 11/05/2021   10:42 AM 11/30/2020    3:04 PM 08/20/2019    9:28 AM  Depression screen PHQ 2/9  Decreased Interest 0 0 0 0 0  Down, Depressed, Hopeless 0 0 0 0 0  PHQ - 2 Score 0 0 0 0 0  Altered sleeping  0 0    Tired, decreased energy  0 0    Change in appetite  0 0    Feeling bad or failure about yourself   0 0    Trouble concentrating  0 0    Moving slowly or fidgety/restless  0 0    Suicidal thoughts  0 0    PHQ-9 Score  0 0    Difficult doing work/chores   Not difficult at all      No Known Allergies Social History   Social History Narrative   Producer, television/film/video, 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.   Current every day smoker. Drinks 3 beers a day. Denies recreational drugs or caffeine use. Denies herbal remedies.   Wear seatbelt. Does not exercise routinely.   Smoke alarm present home.      Past Medical History:  Diagnosis Date   Anemia    Frequent headaches    History of blood transfusion    Hypertension    Past Surgical History:  Procedure Laterality Date   TUBAL LIGATION     Family History  Problem Relation Age of Onset   Arthritis Mother    COPD Father    Cancer Neg Hx    Heart disease Neg Hx    Allergies  as of 06/03/2024   No Known Allergies      Medication List        Accurate as of June 03, 2024  1:21 PM. If you have any questions, ask your nurse or doctor.          amLODipine  10 MG tablet Commonly known as: NORVASC  TAKE 1 TABLET BY MOUTH EVERY DAY   cetirizine  10 MG tablet Commonly known as: ZYRTEC  Take 1 tablet (10 mg total) by mouth daily.   doxycycline  100 MG tablet Commonly known as: VIBRA -TABS Take 1 tablet (100 mg total) by mouth 2 (two) times daily. Started by: Napolean Backbone   lisinopril  40 MG tablet Commonly known as: ZESTRIL  TAKE 1 TABLET BY MOUTH EVERY DAY        All past medical history, surgical history, allergies, family history, immunizations andmedications were updated in the EMR today and reviewed under the history and medication portions of their EMR.     Review of Systems  Constitutional:  Positive  for chills and malaise/fatigue. Negative for fever.  HENT:  Positive for congestion, ear pain, sinus pain and sore throat.   Eyes: Negative.   Respiratory:  Positive for cough and sputum production.   Cardiovascular: Negative.   Gastrointestinal: Negative.   Genitourinary: Negative.   Musculoskeletal: Negative.   Neurological:  Positive for headaches. Negative for dizziness.   Negative, with the exception of above mentioned in HPI   Objective:  BP 122/82   Pulse 92   Temp 98.2 F (36.8 C)   Wt 143 lb (64.9 kg)   LMP 03/15/2016   SpO2 97%   BMI 28.88 kg/m  Body mass index is 28.88 kg/m. Physical Exam Vitals and nursing note reviewed.  Constitutional:      General: She is not in acute distress.    Appearance: Normal appearance. She is not ill-appearing, toxic-appearing or diaphoretic.  HENT:     Head: Normocephalic and atraumatic.     Right Ear: Tympanic membrane, ear canal and external ear normal. There is no impacted cerumen.     Left Ear: Tympanic membrane, ear canal and external ear normal. There is no impacted cerumen.     Nose:  Congestion and rhinorrhea present. Rhinorrhea is purulent.     Right Turbinates: Swollen.     Left Turbinates: Swollen.     Right Sinus: Maxillary sinus tenderness present.     Left Sinus: Maxillary sinus tenderness present.     Mouth/Throat:     Mouth: Mucous membranes are moist.     Pharynx: Postnasal drip present. No oropharyngeal exudate or posterior oropharyngeal erythema.  Eyes:     General: No scleral icterus.       Right eye: No discharge.        Left eye: No discharge.     Extraocular Movements: Extraocular movements intact.     Conjunctiva/sclera: Conjunctivae normal.     Pupils: Pupils are equal, round, and reactive to light.  Cardiovascular:     Rate and Rhythm: Normal rate and regular rhythm.  Pulmonary:     Effort: Pulmonary effort is normal. No respiratory distress.     Breath sounds: Normal breath sounds. No wheezing, rhonchi or rales.  Musculoskeletal:     Right lower leg: No edema.     Left lower leg: No edema.  Skin:    General: Skin is warm.     Findings: No rash.  Neurological:     Mental Status: She is alert and oriented to person, place, and time. Mental status is at baseline.     Motor: No weakness.     Gait: Gait normal.  Psychiatric:        Mood and Affect: Mood normal.        Behavior: Behavior normal.        Thought Content: Thought content normal.        Judgment: Judgment normal.      No results found. No results found. No results found for this or any previous visit (from the past 24 hours).  Assessment/Plan: Carla Phillips is a 58 y.o. female present for OV for  Bacterial sinusitis (Primary) Rest, hydrate.  mucinex  (DM if cough) or coricidin,  nettie pot or nasal saline.  Doxy bid prescribed, take until completed.  IM depo medrol  80 mg provided today F/U 2 weeks of not improved.    Reviewed expectations re: course of current medical issues. Discussed self-management of symptoms. Outlined signs and symptoms indicating need for  more acute intervention. Patient  verbalized understanding and all questions were answered. Patient received an After-Visit Summary.    No orders of the defined types were placed in this encounter.  Meds ordered this encounter  Medications   doxycycline  (VIBRA -TABS) 100 MG tablet    Sig: Take 1 tablet (100 mg total) by mouth 2 (two) times daily.    Dispense:  20 tablet    Refill:  0   Referral Orders  No referral(s) requested today     Note is dictated utilizing voice recognition software. Although note has been proof read prior to signing, occasional typographical errors still can be missed. If any questions arise, please do not hesitate to call for verification.   electronically signed by:  Napolean Backbone, DO   [facility] [place]

## 2024-07-08 ENCOUNTER — Telehealth: Payer: Self-pay

## 2024-07-08 NOTE — Telephone Encounter (Signed)
 Pt has [employer] and states the [facility] does not take her insurance. Please advise if you need us  to change the location to a different location

## 2024-07-08 NOTE — Telephone Encounter (Signed)
 Copied from CRM [id number]. Topic: General - Other >> Jul 08, 2024 12:36 PM Carla Phillips wrote: Reason for CRM: Patient is calling in because she was made an appointment for her mammogram and they called to let her know they are not in network with her insurance so she is wanting to know where else can she go

## 2024-07-10 NOTE — Telephone Encounter (Signed)
 Spoke with patient regarding results/recommendations.

## 2024-07-24 ENCOUNTER — Encounter: Payer: Self-pay | Admitting: Family Medicine

## 2024-07-24 ENCOUNTER — Ambulatory Visit: Payer: 59 | Admitting: Family Medicine

## 2024-07-24 VITALS — BP 124/82 | HR 95 | Temp 97.9°F | Resp 97 | Wt 146.0 lb

## 2024-07-24 DIAGNOSIS — I10 Essential (primary) hypertension: Secondary | ICD-10-CM

## 2024-07-24 DIAGNOSIS — E663 Overweight: Secondary | ICD-10-CM | POA: Diagnosis not present

## 2024-07-24 MED ORDER — LISINOPRIL 40 MG PO TABS: 40.0000 mg | ORAL_TABLET | Freq: Every day | ORAL | 1 refills | Status: DC

## 2024-07-24 MED ORDER — AMLODIPINE BESYLATE 10 MG PO TABS: 10.0000 mg | ORAL_TABLET | Freq: Every day | ORAL | 1 refills | Status: DC

## 2024-07-24 NOTE — Patient Instructions (Addendum)

## 2024-07-24 NOTE — Progress Notes (Signed)
 Labs reviewed    Patient ID: Carla Phillips, female  DOB: 1966/07/11, 58 y.o.   MRN: [id number] Patient Care Team    Relationship Specialty Notifications Start End  Catherine Charlies LABOR, DO PCP - General Family Medicine  10/20/15     Chief Complaint  Patient presents with   Hypertension    Subjective: Carla Phillips is a 58 y.o.  Female  present for Chronic Conditions/illness Management All past medical history, surgical history, allergies, family history, immunizations, medications and social history were updated in the electronic medical record today. All recent labs, ED visits and hospitalizations within the last year were reviewed.  Hypertension/obesity: Pt reports compliance with Lisinopril  40 mg daily and amlodipine  10 mg QD.  Patient denies chest pain, shortness of breath, dizziness or lower extremity edema.   Pt does not take a daily baby ASA. Pt is not prescribed statin. Patient had been on HCTZ 50 mg daily in the past. She never had good control of her blood pressure. She had frequent urination which bothered her.  Diet: Does not watch the sodium in her diet very closely. Exercise: Does not exercise routinely RF: Hypertension, obesity, smoker       07/24/2024   11:06 AM 06/03/2024    1:43 PM 07/10/2023    2:47 PM 01/09/2023    1:57 PM 11/05/2021   10:42 AM  Depression screen PHQ 2/9  Decreased Interest 0 0 0 0 0  Down, Depressed, Hopeless 0 0 0 0 0  PHQ - 2 Score 0 0 0 0 0  Altered sleeping 0   0 0  Tired, decreased energy 0   0 0  Change in appetite 0   0 0  Feeling bad or failure about yourself  0   0 0  Trouble concentrating 0   0 0  Moving slowly or fidgety/restless 0   0 0  Suicidal thoughts 0   0 0  PHQ-9 Score 0   0 0  Difficult doing work/chores Not difficult at all    Not difficult at all      07/24/2024   11:06 AM  GAD 7 : Generalized Anxiety Score  Nervous, Anxious, on Edge 0  Control/stop worrying 0  Worry too much - different things 0   Trouble relaxing 0  Restless 0  Easily annoyed or irritable 0  Afraid - awful might happen 0  Total GAD 7 Score 0  Anxiety Difficulty Not difficult at all     Immunization History  Administered Date(s) Administered   Pneumococcal Polysaccharide-23 08/20/2019   Tdap 10/20/2015   Zoster Recombinant(Shingrix) 01/04/2022, 06/21/2022    Past Medical History:  Diagnosis Date   Anemia    Frequent headaches    History of blood transfusion    Hypertension    No Known Allergies Past Surgical History:  Procedure Laterality Date   TUBAL LIGATION     Family History  Problem Relation Age of Onset   Arthritis Mother    COPD Father    Cancer Neg Hx    Heart disease Neg Hx    Social History   Social History Narrative   Producer, television/film/video, 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.   Current every day smoker. Drinks 3 beers a day. Denies recreational drugs or caffeine use. Denies herbal remedies.   Wear seatbelt. Does not exercise routinely.   Smoke alarm present home.  Allergies as of 07/24/2024   No Known Allergies      Medication List        Accurate as of July 24, 2024 11:11 AM. If you have any questions, ask your nurse or doctor.          STOP taking these medications    doxycycline  100 MG tablet Commonly known as: VIBRA -TABS Stopped by: Charlies Bellini       TAKE these medications    amLODipine  10 MG tablet Commonly known as: NORVASC  Take 1 tablet (10 mg total) by mouth daily.   cetirizine  10 MG tablet Commonly known as: ZYRTEC  Take 1 tablet (10 mg total) by mouth daily.   lisinopril  40 MG tablet Commonly known as: ZESTRIL  Take 1 tablet (40 mg total) by mouth daily.        All past medical history, surgical history, allergies, family history, immunizations andmedications were updated in the EMR today and reviewed under the history and medication portions of their EMR.      No results found for this or any previous visit (from the past 2160 hours).   ROS 14 pt review of systems performed and negative (unless mentioned in an HPI)  Objective: BP 124/82   Pulse 95   Temp 97.9 F (36.6 C)   Resp (!) 97   Wt 146 lb (66.2 kg)   LMP 03/15/2016   BMI 29.49 kg/m  Physical Exam Vitals and nursing note reviewed.  Constitutional:      General: She is not in acute distress.    Appearance: Normal appearance. She is not ill-appearing, toxic-appearing or diaphoretic.  HENT:     Head: Normocephalic and atraumatic.  Eyes:     General: No scleral icterus.       Right eye: No discharge.        Left eye: No discharge.     Extraocular Movements: Extraocular movements intact.     Conjunctiva/sclera: Conjunctivae normal.     Pupils: Pupils are equal, round, and reactive to light.  Cardiovascular:     Rate and Rhythm: Normal rate and regular rhythm.  Pulmonary:     Effort: Pulmonary effort is normal. No respiratory distress.     Breath sounds: Normal breath sounds. No wheezing, rhonchi or rales.  Musculoskeletal:     Right lower leg: No edema.     Left lower leg: No edema.  Skin:    General: Skin is warm.     Findings: No rash.  Neurological:     Mental Status: She is alert and oriented to person, place, and time. Mental status is at baseline.     Motor: No weakness.     Gait: Gait normal.  Psychiatric:        Mood and Affect: Mood normal.        Behavior: Behavior normal.        Thought Content: Thought content normal.        Judgment: Judgment normal.      No results found.  Assessment/plan: Carla Phillips is a 58 y.o. female present for Chronic Conditions/illness Management Essential hypertension/obesity Stable Continue lisinopril  40 mg qd Continue amlodipine  to 10 mg qd Low sodium diet - routine exercise.  Labs up-to-date, due next visit   Return in about 6 months (around 01/27/2025) for cpe (20 min), Routine chronic condition  follow-up.   No orders of the defined types were placed in this encounter.  Meds ordered this encounter  Medications   amLODipine  (NORVASC ) 10  MG tablet    Sig: Take 1 tablet (10 mg total) by mouth daily.    Dispense:  90 tablet    Refill:  1   lisinopril  (ZESTRIL ) 40 MG tablet    Sig: Take 1 tablet (40 mg total) by mouth daily.    Dispense:  90 tablet    Refill:  1   Referral Orders  No referral(s) requested today     Electronically signed by: Charlies Bellini, DO [facility] [place]

## 2024-08-16 ENCOUNTER — Encounter: Payer: Self-pay | Admitting: Family Medicine

## 2024-08-16 ENCOUNTER — Ambulatory Visit: Admitting: Family Medicine

## 2024-08-16 ENCOUNTER — Telehealth: Payer: Self-pay

## 2024-08-16 VITALS — BP 120/80 | HR 93 | Temp 98.1°F | Wt 142.0 lb

## 2024-08-16 DIAGNOSIS — R198 Other specified symptoms and signs involving the digestive system and abdomen: Secondary | ICD-10-CM

## 2024-08-16 MED ORDER — POLYETHYLENE GLYCOL 3350 17 GM/SCOOP PO POWD: 17.0000 g | Freq: Two times a day (BID) | ORAL | 1 refills | Status: AC | PRN

## 2024-08-16 MED ORDER — BISACODYL 10 MG RE SUPP: 10.0000 mg | RECTAL | 0 refills | Status: AC | PRN

## 2024-08-16 NOTE — Progress Notes (Signed)
 Carla Phillips , 13-Nov-1966, 58 y.o., female MRN: [id number] Patient Care Team    Relationship Specialty Notifications Start End  Catherine Charlies LABOR, DO PCP - General Family Medicine  10/20/15     Chief Complaint  Patient presents with   Constipation    A few weeks on and off. Pt has tried Miralax  and Colace.      Subjective: Carla Phillips is a 58 y.o. Pt presents for an OV with complaints of bowel habit changes of 2 weeks duration.  Patient reports she had a small bowel movement this morning that was diarrhea.  She had taken Colace and Dulcolax liquid yesterday.  She reports the bowel movement prior was about 2 weeks ago in which she had experienced multiple bouts of diarrhea throughout the day.  She states it had resolved within 24 hours, but her bowel habits have not been the same since.  She is uncertain if she has gone in the last 2 weeks between those accounts, if so she states she does not believe she had gone much.  She does endorse flatulence.  She denies rectal bleeding, nausea, vomiting or abdominal pain.  She has never had problems with constipation in the past.  She denies any change in eating habits over-the-counter supplements or medications.     07/24/2024   11:06 AM 06/03/2024    1:43 PM 07/10/2023    2:47 PM 01/09/2023    1:57 PM 11/05/2021   10:42 AM  Depression screen PHQ 2/9  Decreased Interest 0 0 0 0 0  Down, Depressed, Hopeless 0 0 0 0 0  PHQ - 2 Score 0 0 0 0 0  Altered sleeping 0   0 0  Tired, decreased energy 0   0 0  Change in appetite 0   0 0  Feeling bad or failure about yourself  0   0 0  Trouble concentrating 0   0 0  Moving slowly or fidgety/restless 0   0 0  Suicidal thoughts 0   0 0  PHQ-9 Score 0   0 0  Difficult doing work/chores Not difficult at all    Not difficult at all    No Known Allergies Social History   Social History Narrative   Producer, television/film/video, 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 Also works for [employer].   Current every day smoker. Drinks 3 beers a day. Denies recreational drugs or caffeine use. Denies herbal remedies.   Wear seatbelt. Does not exercise routinely.   Smoke alarm present home.      Past Medical History:  Diagnosis Date   Anemia    Frequent headaches    History of blood transfusion    Hypertension    Past Surgical History:  Procedure Laterality Date   TUBAL LIGATION     Family History  Problem Relation Age of Onset   Arthritis Mother    COPD Father    Cancer Neg Hx    Heart disease Neg Hx    Allergies as of 08/16/2024   No Known Allergies      Medication List        Accurate as of August 16, 2024  2:53 PM. If you have any questions, ask your nurse or doctor.          amLODipine  10 MG tablet Commonly known as: NORVASC  Take 1 tablet (10 mg total) by mouth daily.  bisacodyl  10 MG suppository Commonly known as: Dulcolax Place 1 suppository (10 mg total) rectally as needed for moderate constipation. Started by: Charlies Bellini   cetirizine  10 MG tablet Commonly known as: ZYRTEC  Take 1 tablet (10 mg total) by mouth daily.   lisinopril  40 MG tablet Commonly known as: ZESTRIL  Take 1 tablet (40 mg total) by mouth daily.   polyethylene glycol powder 17 GM/SCOOP powder Commonly known as: GLYCOLAX /MIRALAX  Take 17 g by mouth 2 (two) times daily as needed. Started by: Charlies Bellini        All past medical history, surgical history, allergies, family history, immunizations andmedications were updated in the EMR today and reviewed under the history and medication portions of their EMR.     ROS Negative, with the exception of above mentioned in HPI   Objective:  BP 120/80   Pulse 93   Temp 98.1 F (36.7 C)   Wt 142 lb (64.4 kg)   LMP 03/15/2016   SpO2 98%   BMI 28.68 kg/m  Body mass index is 28.68 kg/m. Physical Exam Vitals and nursing note reviewed.  Constitutional:      General: She is not  in acute distress.    Appearance: Normal appearance. She is normal weight. She is not ill-appearing or toxic-appearing.  HENT:     Head: Normocephalic and atraumatic.  Eyes:     General: No scleral icterus.       Right eye: No discharge.        Left eye: No discharge.     Extraocular Movements: Extraocular movements intact.     Conjunctiva/sclera: Conjunctivae normal.     Pupils: Pupils are equal, round, and reactive to light.  Cardiovascular:     Rate and Rhythm: Normal rate and regular rhythm.  Pulmonary:     Effort: Pulmonary effort is normal.     Breath sounds: Normal breath sounds.  Abdominal:     General: Abdomen is protuberant. Bowel sounds are increased. There is distension.     Palpations: Abdomen is soft. There is no fluid wave or mass.     Tenderness: There is no abdominal tenderness. There is no guarding or rebound.     Hernia: No hernia is present.  Skin:    Findings: No rash.  Neurological:     Mental Status: She is alert and oriented to person, place, and time. Mental status is at baseline.     Motor: No weakness.     Coordination: Coordination normal.     Gait: Gait normal.  Psychiatric:        Mood and Affect: Mood normal.        Behavior: Behavior normal.        Thought Content: Thought content normal.        Judgment: Judgment normal.      No results found. No results found. No results found for this or any previous visit (from the past 24 hours).  Assessment/Plan: Carla Phillips is a 58 y.o. female present for OV for  Change in bowel function (Primary) Exam is normal today with the exception of increased bowel sounds and mild distention.  She is not tender and unable to palpate a large amount of stool burden. Start MiraLAX  1 cap twice daily in 8 ounces of water Start suppository once daily for the next 2-3 days or until bowels start to move regularly. If bowel movement is not produced by Sunday encouraged her to try a fleets enema OTC If  abdominal pain  occurs without bowel movement, or continued flatulence, she is encouraged to report to the emergency room for evaluation.  Reviewed expectations re: course of current medical issues. Discussed self-management of symptoms. Outlined signs and symptoms indicating need for more acute intervention. Patient verbalized understanding and all questions were answered. Patient received an After-Visit Summary.    No orders of the defined types were placed in this encounter.  Meds ordered this encounter  Medications   bisacodyl  (DULCOLAX) 10 MG suppository    Sig: Place 1 suppository (10 mg total) rectally as needed for moderate constipation.    Dispense:  12 suppository    Refill:  0   polyethylene glycol powder (GLYCOLAX /MIRALAX ) 17 GM/SCOOP powder    Sig: Take 17 g by mouth 2 (two) times daily as needed.    Dispense:  3350 g    Refill:  1   Referral Orders  No referral(s) requested today     Note is dictated utilizing voice recognition software. Although note has been proof read prior to signing, occasional typographical errors still can be missed. If any questions arise, please do not hesitate to call for verification.   electronically signed by:  Charlies Bellini, DO  [facility] [place]

## 2024-08-16 NOTE — Telephone Encounter (Signed)
 Communication  Reason for CRM: Patient called in regarding experiencing constipation, patient wanted to know if something could be sent in or if she needs to come in she has scheduled an appointment but if its not neccessary she will cancel   Pt will need an appt. Pt was scheduled for today.

## 2024-08-16 NOTE — Patient Instructions (Addendum)
 Return in about 5 days (around 08/21/2024), or if symptoms worsen or fail to improve.        Great to see you today.  I have refilled the medication(s) we provide.   If labs were collected or images ordered, we will inform you of  results once we have received them and reviewed. We will contact you either by echart message, or telephone call.  Please give ample time to the testing facility, and our office to run,  receive and review results. Please do not call inquiring of results, even if you can see them in your chart. We will contact you as soon as we are able. If it has been over 1 week since the test was completed, and you have not yet heard from us , then please call us .    - echart message- for normal results that have been seen by the patient already.   - telephone call: abnormal results or if patient has not viewed results in their echart.  If a referral to a specialist was entered for you, please call us  in 2 weeks if you have not heard from the specialist office to schedule.

## 2025-01-27 ENCOUNTER — Other Ambulatory Visit: Payer: Self-pay

## 2025-01-27 DIAGNOSIS — I10 Essential (primary) hypertension: Secondary | ICD-10-CM

## 2025-01-27 MED ORDER — LISINOPRIL 40 MG PO TABS: 40.0000 mg | ORAL_TABLET | Freq: Every day | ORAL | 0 refills | Status: AC

## 2025-01-27 MED ORDER — AMLODIPINE BESYLATE 10 MG PO TABS: 10.0000 mg | ORAL_TABLET | Freq: Every day | ORAL | 0 refills | Status: AC

## 2025-01-27 MED ORDER — CETIRIZINE HCL 10 MG PO TABS: 10.0000 mg | ORAL_TABLET | Freq: Every day | ORAL | 0 refills | Status: AC

## 2025-01-29 ENCOUNTER — Encounter: Admitting: Family Medicine

## 2025-03-13 ENCOUNTER — Encounter: Admitting: Family Medicine
